# Patient Record
Sex: Male | Born: 1935 | Race: White | Hispanic: No | Marital: Married | Smoking: Current every day smoker
Health system: Southern US, Community
[De-identification: ages and names within clinical notes are randomized; demographics above are authoritative.]

## PROBLEM LIST (undated history)

## (undated) DIAGNOSIS — IMO0002 Reserved for concepts with insufficient information to code with codable children: Secondary | ICD-10-CM

## (undated) DIAGNOSIS — I4891 Unspecified atrial fibrillation: Secondary | ICD-10-CM

## (undated) DIAGNOSIS — M810 Age-related osteoporosis without current pathological fracture: Secondary | ICD-10-CM

## (undated) DIAGNOSIS — N289 Disorder of kidney and ureter, unspecified: Secondary | ICD-10-CM

## (undated) DIAGNOSIS — R609 Edema, unspecified: Secondary | ICD-10-CM

## (undated) DIAGNOSIS — R443 Hallucinations, unspecified: Secondary | ICD-10-CM

## (undated) DIAGNOSIS — I6930 Unspecified sequelae of cerebral infarction: Secondary | ICD-10-CM

## (undated) DIAGNOSIS — N400 Enlarged prostate without lower urinary tract symptoms: Secondary | ICD-10-CM

## (undated) DIAGNOSIS — K08109 Complete loss of teeth, unspecified cause, unspecified class: Secondary | ICD-10-CM

## (undated) DIAGNOSIS — N183 Chronic kidney disease, stage 3 unspecified: Secondary | ICD-10-CM

## (undated) DIAGNOSIS — I639 Cerebral infarction, unspecified: Secondary | ICD-10-CM

## (undated) DIAGNOSIS — E538 Deficiency of other specified B group vitamins: Secondary | ICD-10-CM

## (undated) DIAGNOSIS — F0390 Unspecified dementia without behavioral disturbance: Secondary | ICD-10-CM

## (undated) DIAGNOSIS — R2681 Unsteadiness on feet: Secondary | ICD-10-CM

## (undated) DIAGNOSIS — E559 Vitamin D deficiency, unspecified: Secondary | ICD-10-CM

## (undated) DIAGNOSIS — Z125 Encounter for screening for malignant neoplasm of prostate: Secondary | ICD-10-CM

## (undated) HISTORY — DX: Hallucinations, unspecified: R44.3

## (undated) HISTORY — DX: Chronic kidney disease, stage 3 (moderate): N18.3

## (undated) HISTORY — DX: Unspecified dementia, unspecified severity, without behavioral disturbance, psychotic disturbance, mood disturbance, and anxiety: F03.90

## (undated) HISTORY — DX: Age-related osteoporosis without current pathological fracture: M81.0

## (undated) HISTORY — DX: Reserved for concepts with insufficient information to code with codable children: IMO0002

## (undated) HISTORY — DX: Unspecified sequelae of cerebral infarction: I69.30

## (undated) HISTORY — DX: Benign prostatic hyperplasia without lower urinary tract symptoms: N40.0

## (undated) HISTORY — DX: Vitamin D deficiency, unspecified: E55.9

## (undated) HISTORY — PX: OTHER SURGICAL HISTORY: SHX169

## (undated) HISTORY — DX: Chronic kidney disease, stage 3 unspecified: N18.30

## (undated) HISTORY — DX: Complete loss of teeth, unspecified cause, unspecified class: K08.109

## (undated) HISTORY — DX: Edema, unspecified: R60.9

## (undated) HISTORY — DX: Unsteadiness on feet: R26.81

## (undated) HISTORY — DX: Encounter for screening for malignant neoplasm of prostate: Z12.5

## (undated) HISTORY — DX: Unspecified atrial fibrillation: I48.91

## (undated) HISTORY — DX: Deficiency of other specified B group vitamins: E53.8

---

## 2009-03-04 ENCOUNTER — Ambulatory Visit: Payer: Self-pay | Admitting: Family Medicine

## 2012-04-07 ENCOUNTER — Emergency Department: Payer: Self-pay | Admitting: Emergency Medicine

## 2013-03-29 ENCOUNTER — Ambulatory Visit: Payer: Self-pay | Admitting: Ophthalmology

## 2013-11-02 ENCOUNTER — Emergency Department: Payer: Self-pay | Admitting: Emergency Medicine

## 2013-11-02 LAB — COMPREHENSIVE METABOLIC PANEL
ALBUMIN: 3.3 g/dL — AB (ref 3.4–5.0)
ANION GAP: 9 (ref 7–16)
Alkaline Phosphatase: 69 U/L
BUN: 20 mg/dL — ABNORMAL HIGH (ref 7–18)
Bilirubin,Total: 2.3 mg/dL — ABNORMAL HIGH (ref 0.2–1.0)
CREATININE: 1.85 mg/dL — AB (ref 0.60–1.30)
Calcium, Total: 8.5 mg/dL (ref 8.5–10.1)
Chloride: 101 mmol/L (ref 98–107)
Co2: 24 mmol/L (ref 21–32)
EGFR (African American): 39 — ABNORMAL LOW
EGFR (Non-African Amer.): 34 — ABNORMAL LOW
Glucose: 100 mg/dL — ABNORMAL HIGH (ref 65–99)
OSMOLALITY: 271 (ref 275–301)
Potassium: 4.1 mmol/L (ref 3.5–5.1)
SGOT(AST): 49 U/L — ABNORMAL HIGH (ref 15–37)
SGPT (ALT): 27 U/L
SODIUM: 134 mmol/L — AB (ref 136–145)
TOTAL PROTEIN: 7.5 g/dL (ref 6.4–8.2)

## 2013-11-02 LAB — PROTIME-INR
INR: 1.3
Prothrombin Time: 15.8 secs — ABNORMAL HIGH (ref 11.5–14.7)

## 2013-11-02 LAB — TROPONIN I

## 2013-11-03 LAB — URINALYSIS, COMPLETE
BACTERIA: NONE SEEN
BILIRUBIN, UR: NEGATIVE
Glucose,UR: NEGATIVE mg/dL (ref 0–75)
KETONE: NEGATIVE
Leukocyte Esterase: NEGATIVE
Nitrite: NEGATIVE
PH: 5 (ref 4.5–8.0)
PROTEIN: NEGATIVE
RBC,UR: 44 /HPF (ref 0–5)
Specific Gravity: 1.021 (ref 1.003–1.030)
WBC UR: 2 /HPF (ref 0–5)

## 2013-11-03 LAB — CBC WITH DIFFERENTIAL/PLATELET
BANDS NEUTROPHIL: 6 %
COMMENT - H1-COM2: NORMAL
HCT: 48 % (ref 40.0–52.0)
HGB: 16 g/dL (ref 13.0–18.0)
LYMPHS PCT: 8 %
MCH: 32.9 pg (ref 26.0–34.0)
MCHC: 33.3 g/dL (ref 32.0–36.0)
MCV: 99 fL (ref 80–100)
MONOS PCT: 6 %
RBC: 4.85 10*6/uL (ref 4.40–5.90)
RDW: 14 % (ref 11.5–14.5)
Segmented Neutrophils: 80 %
WBC: 4.1 10*3/uL (ref 3.8–10.6)

## 2013-11-04 LAB — URINE CULTURE

## 2014-04-13 ENCOUNTER — Ambulatory Visit: Payer: Self-pay | Admitting: Family

## 2015-05-23 ENCOUNTER — Inpatient Hospital Stay: Payer: PRIVATE HEALTH INSURANCE | Admitting: Internal Medicine

## 2015-07-16 ENCOUNTER — Ambulatory Visit: Payer: PRIVATE HEALTH INSURANCE | Admitting: Internal Medicine

## 2015-07-30 ENCOUNTER — Encounter: Payer: Self-pay | Admitting: Hematology and Oncology

## 2015-07-30 ENCOUNTER — Inpatient Hospital Stay: Payer: Medicare (Managed Care)

## 2015-07-30 ENCOUNTER — Telehealth: Payer: Self-pay | Admitting: *Deleted

## 2015-07-30 ENCOUNTER — Inpatient Hospital Stay: Payer: Medicare (Managed Care) | Attending: Hematology and Oncology | Admitting: Hematology and Oncology

## 2015-07-30 VITALS — BP 134/82 | HR 60 | Temp 96.2°F | Resp 18 | Ht 67.0 in | Wt 139.4 lb

## 2015-07-30 DIAGNOSIS — I69349 Monoplegia of lower limb following cerebral infarction affecting unspecified side: Secondary | ICD-10-CM | POA: Diagnosis not present

## 2015-07-30 DIAGNOSIS — Z8673 Personal history of transient ischemic attack (TIA), and cerebral infarction without residual deficits: Secondary | ICD-10-CM

## 2015-07-30 DIAGNOSIS — Z9181 History of falling: Secondary | ICD-10-CM | POA: Diagnosis not present

## 2015-07-30 DIAGNOSIS — D7589 Other specified diseases of blood and blood-forming organs: Secondary | ICD-10-CM

## 2015-07-30 DIAGNOSIS — F17210 Nicotine dependence, cigarettes, uncomplicated: Secondary | ICD-10-CM | POA: Insufficient documentation

## 2015-07-30 DIAGNOSIS — M549 Dorsalgia, unspecified: Secondary | ICD-10-CM | POA: Diagnosis not present

## 2015-07-30 DIAGNOSIS — R7989 Other specified abnormal findings of blood chemistry: Secondary | ICD-10-CM

## 2015-07-30 DIAGNOSIS — R05 Cough: Secondary | ICD-10-CM | POA: Diagnosis not present

## 2015-07-30 DIAGNOSIS — K449 Diaphragmatic hernia without obstruction or gangrene: Secondary | ICD-10-CM | POA: Diagnosis not present

## 2015-07-30 DIAGNOSIS — D696 Thrombocytopenia, unspecified: Secondary | ICD-10-CM | POA: Diagnosis not present

## 2015-07-30 DIAGNOSIS — Z87311 Personal history of (healed) other pathological fracture: Secondary | ICD-10-CM | POA: Insufficient documentation

## 2015-07-30 DIAGNOSIS — F0390 Unspecified dementia without behavioral disturbance: Secondary | ICD-10-CM | POA: Diagnosis not present

## 2015-07-30 DIAGNOSIS — N183 Chronic kidney disease, stage 3 (moderate): Secondary | ICD-10-CM | POA: Diagnosis not present

## 2015-07-30 DIAGNOSIS — R634 Abnormal weight loss: Secondary | ICD-10-CM

## 2015-07-30 DIAGNOSIS — Z7901 Long term (current) use of anticoagulants: Secondary | ICD-10-CM | POA: Diagnosis not present

## 2015-07-30 DIAGNOSIS — N400 Enlarged prostate without lower urinary tract symptoms: Secondary | ICD-10-CM | POA: Insufficient documentation

## 2015-07-30 DIAGNOSIS — R17 Unspecified jaundice: Secondary | ICD-10-CM | POA: Insufficient documentation

## 2015-07-30 DIAGNOSIS — K5730 Diverticulosis of large intestine without perforation or abscess without bleeding: Secondary | ICD-10-CM | POA: Diagnosis not present

## 2015-07-30 DIAGNOSIS — R791 Abnormal coagulation profile: Secondary | ICD-10-CM | POA: Diagnosis present

## 2015-07-30 DIAGNOSIS — I4891 Unspecified atrial fibrillation: Secondary | ICD-10-CM

## 2015-07-30 LAB — HEPATIC FUNCTION PANEL
ALT: 10 U/L — ABNORMAL LOW (ref 17–63)
AST: 21 U/L (ref 15–41)
Albumin: 3.8 g/dL (ref 3.5–5.0)
Alkaline Phosphatase: 70 U/L (ref 38–126)
Bilirubin, Direct: 0.5 mg/dL (ref 0.1–0.5)
Indirect Bilirubin: 2.8 mg/dL — ABNORMAL HIGH (ref 0.3–0.9)
Total Bilirubin: 3.3 mg/dL — ABNORMAL HIGH (ref 0.3–1.2)
Total Protein: 7.5 g/dL (ref 6.5–8.1)

## 2015-07-30 LAB — CBC WITH DIFFERENTIAL/PLATELET
Basophils Absolute: 0 10*3/uL (ref 0–0.1)
Basophils Relative: 0 %
Eosinophils Absolute: 0.2 10*3/uL (ref 0–0.7)
Eosinophils Relative: 4 %
HCT: 44.8 % (ref 40.0–52.0)
Hemoglobin: 15.3 g/dL (ref 13.0–18.0)
Lymphocytes Relative: 31 %
Lymphs Abs: 1.9 10*3/uL (ref 1.0–3.6)
MCH: 32.8 pg (ref 26.0–34.0)
MCHC: 34.1 g/dL (ref 32.0–36.0)
MCV: 96.2 fL (ref 80.0–100.0)
Monocytes Absolute: 0.3 10*3/uL (ref 0.2–1.0)
Monocytes Relative: 6 %
Neutro Abs: 3.6 10*3/uL (ref 1.4–6.5)
Neutrophils Relative %: 59 %
Platelets: 107 10*3/uL — ABNORMAL LOW (ref 150–440)
RBC: 4.66 MIL/uL (ref 4.40–5.90)
RDW: 14.1 % (ref 11.5–14.5)
WBC: 6 10*3/uL (ref 3.8–10.6)

## 2015-07-30 LAB — RETICULOCYTES
RBC.: 4.66 MIL/uL (ref 4.40–5.90)
Retic Count, Absolute: 55.9 10*3/uL (ref 19.0–183.0)
Retic Ct Pct: 1.2 % (ref 0.4–3.1)

## 2015-07-30 LAB — TSH: TSH: 4.379 u[IU]/mL (ref 0.350–4.500)

## 2015-07-30 LAB — FOLATE: Folate: 16.7 ng/mL (ref >5.9)

## 2015-07-30 LAB — VITAMIN B12: Vitamin B-12: 192 pg/mL (ref 180–914)

## 2015-07-30 LAB — FIBRIN DERIVATIVES D-DIMER (ARMC ONLY): Fibrin derivatives D-dimer (ARMC): 3307 — ABNORMAL HIGH (ref 0–499)

## 2015-07-30 NOTE — Telephone Encounter (Signed)
Called the only phone number I have which is mobile and left message that pt has appt u/s tom at 2 pm.  I also got a call from [facility] and tiffany said they will transport the pt to the u/s appt. I did leave this info on the message for the pt.

## 2015-07-30 NOTE — Progress Notes (Signed)
Pt unsure of why he is here today.  Pt lives with daughter and son in law in [place].  No complaints no concerns

## 2015-07-30 NOTE — Progress Notes (Addendum)
[facility]  Clinic day:  07/30/2015  Chief Complaint: Raymond Hughes is a 82 y.o. male with an elevated D-dimer who is referred in consultation by Raymond Salts, FNP for assessment and management.  HPI: The patient has a history of atrial fibrillation dating back to 2010. Per report, he has been off Coumadin since 06/2013. He denies any history of thrombosis. 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 He noted a history of back pain for 6 days aggravated by his fall. Plain films revealed a T12 compression fracture. D-dimers were markedly elevated 2425 (<230).  CBC revealed a hematocrit of 46.0, hemoglobin 15.1, MCV 100.8, platelets 117,000, white count 6700 with an ANC of 4300. Differential was unremarkable.  Comprehensive metabolic panel included a creatinine of 1.2 to a bilirubin of 3.3. Alkaline phosphatase was normal (89).  Troponin was less than 0.06. EKG confirmed atrial fibrillation without acute ST abnormality.   CT of the lumbar spine revealed a T12 compression fracture with small central loss of height. There was also compression deformity of L5 increased since 08/06/2014. CT angiogram revealed no evidence of pulmonary emboli.  He had extensive calcified and noncalcified atheromatous plaque throughout the aorta. He had a large hiatal hernia and emphysema. There was colonic diverticulosis. There was a T12 and L5 compression fracture.   Bilateral lower extremity duplex on 05/04/2015 by [name] revealed no evidence of DVT in the left lower extremity. Right lower extremity ultrasound was  limited as the patient was agitated and would not allow the technician to evaluate it. Follow-up right lower extremity duplex was recommended.  Follow-up D-dimer was 9.04 (0 - 0.49) on 07/26/2015.  Symptomatically, he notes back pain. He has lost about 20  pounds over an unspecified period of time. He has a chronic cough secondary to smoking. He note trouble swallowing food at times. He has not had an EGD or colonoscopy in " a while".  He denies any melena or hematochezia. He drinks 1 shot of alcohol ([place] 101) a month.   Past Medical History  Diagnosis Date  . Edema   . Dementia   . Osteoporosis   . Hemiplegia affecting non-dominant side, post-stroke (HCC)   . Complete edentulism   . Benign prostatic hypertrophy   . Hallucinations   . Late effect of stroke   . Chronic kidney disease, stage III (moderate)   . Unsteady gait   . Vitamin D deficiency   . Vitamin B12 deficiency   . Special screening for malignant neoplasm of prostate   . Atrial fibrillation (HCC)   . Stroke [name])     Past Surgical History  Procedure Laterality Date  . Cataracts      Family History  Problem Relation Age of Onset  . Diabetes Mother   . Cancer Daughter   . Diabetes Daughter   . Diabetes Son   . Diabetes Son   . Diabetes Son   . Diabetes Son     Social History:  reports that he has been smoking Cigarettes.  He has been smoking about 1.00 pack per day. He has never used smokeless tobacco. He reports that he drinks alcohol. He reports that he does not use illicit drugs.  He was in the [employer] for 10 years.  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  The  patient is originally from [place].  He moved to [place] to be with his daughter.  He then moved to [place] to be with another daughter.  He lives with his daughter and son-in-law in [place].  The patient is accompanied by Raymond Hughes from [name] today.  Allergies: No Known Allergies  Current Medications: Tylenol 650 mg po TID prn pain.  Review of Systems:  GENERAL:  Feels "fine".  No fevers or sweats .  Weight years ago was "240 pounds".  Weight loss of 20 pounds in unspecified period. PERFORMANCE STATUS  (ECOG):  2 HEENT:  No visual changes, runny nose, sore throat, mouth sores or tenderness. Lungs: Cough.  No shortness of breath.  No hemoptysis. Cardiac:  No chest pain, palpitations, orthopnea, or PND. GI:  Intermittent emesis.  Trouble swallowing food at times.  No nausea, vomiting, diarrhea, constipation, melena or hematochezia.  Long time since EGD or colonoscopy. GU:  No urgency, frequency, dysuria, or hematuria. Musculoskeletal:  No back pain.  Knee problems.  No muscle tenderness. Extremities:  No pain or swelling. Skin:  No rashes or skin changes. Neuro:  No headache, numbness or weakness, balance or coordination issues. Endocrine:  No diabetes, thyroid issues, hot flashes or night sweats. Psych:  No mood changes, depression or anxiety. Pain:  No focal pain. Review of systems:  All other systems reviewed and found to be negative.  Physical Exam: Blood pressure 134/82, pulse 60, temperature 96.2 F (35.7 C), temperature source Tympanic, resp. rate 18, height 5\' 7"  (1.702 m), weight 139 lb 7.1 oz (63.25 kg). GENERAL:  Elderly disheveled gentleman sitting comfortably in the exam room in no acute distress.  He has a rolling walker at his side. MENTAL STATUS:  Alert and oriented to person, place and time. HEAD:  Wallace Cullens hair and beard.  Normocephalic, atraumatic, face symmetric, no Cushingoid features. EYES:  Blue eyes.  Pupils equal round and reactive to light and accomodation.  No conjunctivitis or scleral icterus. ENT:  Oropharynx clear without lesion.  Upper and lower dentures.  Tongue normal. Mucous membranes moist.  RESPIRATORY:  Poor respiratory excursion.  Clear to auscultation without rales, wheezes or rhonchi. CARDIOVASCULAR:  Irregular rhythm without murmur, rub or gallop. ABDOMEN:  Soft, non-tender, with active bowel sounds, and no hepatosplenomegaly.  No masses. BACK:  Localizes pain to T12 and L4/L5 areas. SKIN:  Nicotine stained fingers.  No rashes, ulcers or  lesions. EXTREMITIES: Left lower extremity edema to knee.  No skin discoloration or tenderness.  No palpable cords. LYMPH NODES: No palpable cervical, supraclavicular, axillary or inguinal adenopathy  NEUROLOGICAL: Unremarkable. PSYCH:  Appropriate.  Appointment on 07/30/2015  Component Date Value Ref Range Status  . WBC 07/30/2015 6.0  3.8 - 10.6 K/uL Final  . RBC 07/30/2015 4.66  4.40 - 5.90 MIL/uL Final  . Hemoglobin 07/30/2015 15.3  13.0 - 18.0 g/dL Final  . HCT 96/06/5407 44.8  40.0 - 52.0 % Final  . MCV 07/30/2015 96.2  80.0 - 100.0 fL Final  . MCH 07/30/2015 32.8  26.0 - 34.0 pg Final  . MCHC 07/30/2015 34.1  32.0 - 36.0 g/dL Final  . RDW 81/19/1478 14.1  11.5 - 14.5 % Final  . Platelets 07/30/2015 107* 150 - 440 K/uL Final  . Neutrophils Relative % 07/30/2015 59   Final  . Neutro Abs 07/30/2015 3.6  1.4 - 6.5 K/uL Final  . Lymphocytes Relative 07/30/2015 31   Final  . Lymphs Abs 07/30/2015 1.9  1.0 - 3.6 K/uL Final  . Monocytes Relative 07/30/2015 6   Final  . Monocytes Absolute 07/30/2015 0.3  0.2 - 1.0 K/uL Final  . Eosinophils Relative 07/30/2015 4   Final  . Eosinophils Absolute 07/30/2015 0.2  0 - 0.7 K/uL Final  . Basophils Relative 07/30/2015 0   Final  . Basophils Absolute 07/30/2015 0.0  0 - 0.1 K/uL Final  . Vitamin B-12 07/30/2015 192  180 - 914 pg/mL Final   Comment: (NOTE) This assay is not validated for testing neonatal or myeloproliferative syndrome specimens for Vitamin B12 levels. [facility]   . Folate 07/30/2015 16.7  >5.9 ng/mL Final  . TSH 07/30/2015 4.379  0.350 - 4.500 uIU/mL Final  . Retic Ct Pct 07/30/2015 1.2  0.4 - 3.1 % Final  . RBC. 07/30/2015 4.66  4.40 - 5.90 MIL/uL Final  . Retic Count, Manual 07/30/2015 55.9  19.0 - 183.0 K/uL Final  . Fibrin derivatives D-dimer ([facility]) 07/30/2015 3307* 0 - 499 Final   Comment: <> Exclusion of Venous Thromboembolism (VTE) - OUTPATIENTS ONLY        (Emergency Department or [place])              0-499 ng/ml (FEU)  : With a low to intermediate pretest                                        probability for VTE this test result                                        excludes the diagnosis of VTE.           > 499 ng/ml (FEU)  : VTE not excluded.  Additional work up                                   for VTE is required.   <>  Testing on Inpatients and Evaluation of Disseminated Intravascular        Coagulation (DIC)             Reference Range:   0-499 ng/ml (FEU)   Office Visit on 07/30/2015  Component Date Value Ref Range Status  . Total Protein 07/30/2015 7.5  6.5 - 8.1 g/dL Final  . Albumin 16/10/960405/05/2015 3.8  3.5 - 5.0 g/dL Final  . AST 54/09/811905/05/2015 21  15 - 41 U/L Final  . ALT 07/30/2015 10* 17 - 63 U/L Final  . Alkaline Phosphatase 07/30/2015 70  38 - 126 U/L Final  . Total Bilirubin 07/30/2015 3.3* 0.3 - 1.2 mg/dL Final  . Bilirubin, Direct 07/30/2015 0.5  0.1 - 0.5 mg/dL Final  . Indirect Bilirubin 07/30/2015 2.8* 0.3 - 0.9 mg/dL Final    Assessment:  Raymond Hughes is a 82 y.o. male with a history of atrial fibrillation and left sided CVA with an elevated D-Dimer initially documented on 04/28/2015.  Chest CT angiogram on 04/28/2015 revealed no evidence of pulmonary embolism.  Left lower extremity duplex on 05/04/2015 revealed no evidence of DVT.  Right lower extremity duplex could not be performed secondary to patient cooperation.  He has a T12 and L5 compression fracture.  Outside labs reveal mild thrombocytopenia, macrocytic RBC indices,  and an elevated bilirubin.  Symptomatically, he notes back pain, some vague issues with swallowing.  He has lost 20 pounds in an unspecified period of time.  Exam reveals left lower extremity edema.  Plan: 1.  Discuss elevated D-dimer and additional lab abnormalities noted in ER.  Discuss follow-up testing.  Discuss repeating bilateral lower extremity duplex. 2.  Labs today: CBC with diff, LFTs, fractionated bilirubin, B12,  folate, TSH, retic, D-dimer,   3.  Bilateral lower extremity duplex.  Discuss importance of patient cooperation with study. 4.  Discuss potential kyphoplasty of compression fractures for relief of pain. 5.  Consider GI referral for EGD and colonoscopy. 6.  RTC in 1 week for review of testing.   Rosey Bath, MD  07/30/2015, 12:15 PM

## 2015-07-31 ENCOUNTER — Ambulatory Visit
Admission: RE | Admit: 2015-07-31 | Discharge: 2015-07-31 | Disposition: A | Discharge status: Home / Self Care | Payer: Medicare (Managed Care) | Source: Ambulatory Visit | Attending: Hematology and Oncology | Admitting: Hematology and Oncology

## 2015-07-31 ENCOUNTER — Encounter: Payer: Self-pay | Admitting: *Deleted

## 2015-07-31 ENCOUNTER — Telehealth: Payer: Self-pay | Admitting: Internal Medicine

## 2015-07-31 ENCOUNTER — Other Ambulatory Visit: Payer: Self-pay | Admitting: Internal Medicine

## 2015-07-31 ENCOUNTER — Inpatient Hospital Stay
Admission: EM | Admit: 2015-07-31 | Discharge: 2015-08-01 | DRG: 300 | Disposition: A | Discharge status: Home / Self Care | Payer: Medicare (Managed Care) | Attending: Internal Medicine | Admitting: Internal Medicine

## 2015-07-31 ENCOUNTER — Encounter: Payer: Self-pay | Admitting: Hematology and Oncology

## 2015-07-31 DIAGNOSIS — Z8673 Personal history of transient ischemic attack (TIA), and cerebral infarction without residual deficits: Secondary | ICD-10-CM | POA: Diagnosis not present

## 2015-07-31 DIAGNOSIS — E538 Deficiency of other specified B group vitamins: Secondary | ICD-10-CM | POA: Diagnosis present

## 2015-07-31 DIAGNOSIS — I69354 Hemiplegia and hemiparesis following cerebral infarction affecting left non-dominant side: Secondary | ICD-10-CM | POA: Diagnosis not present

## 2015-07-31 DIAGNOSIS — D696 Thrombocytopenia, unspecified: Secondary | ICD-10-CM | POA: Diagnosis present

## 2015-07-31 DIAGNOSIS — Z833 Family history of diabetes mellitus: Secondary | ICD-10-CM | POA: Diagnosis not present

## 2015-07-31 DIAGNOSIS — M810 Age-related osteoporosis without current pathological fracture: Secondary | ICD-10-CM | POA: Diagnosis present

## 2015-07-31 DIAGNOSIS — H269 Unspecified cataract: Secondary | ICD-10-CM | POA: Diagnosis present

## 2015-07-31 DIAGNOSIS — N183 Chronic kidney disease, stage 3 (moderate): Secondary | ICD-10-CM | POA: Diagnosis present

## 2015-07-31 DIAGNOSIS — Z79899 Other long term (current) drug therapy: Secondary | ICD-10-CM | POA: Diagnosis not present

## 2015-07-31 DIAGNOSIS — I4891 Unspecified atrial fibrillation: Secondary | ICD-10-CM | POA: Diagnosis present

## 2015-07-31 DIAGNOSIS — Z9119 Patient's noncompliance with other medical treatment and regimen: Secondary | ICD-10-CM

## 2015-07-31 DIAGNOSIS — I82412 Acute embolism and thrombosis of left femoral vein: Secondary | ICD-10-CM | POA: Insufficient documentation

## 2015-07-31 DIAGNOSIS — F0390 Unspecified dementia without behavioral disturbance: Secondary | ICD-10-CM | POA: Diagnosis present

## 2015-07-31 DIAGNOSIS — Z809 Family history of malignant neoplasm, unspecified: Secondary | ICD-10-CM | POA: Diagnosis not present

## 2015-07-31 DIAGNOSIS — I82409 Acute embolism and thrombosis of unspecified deep veins of unspecified lower extremity: Secondary | ICD-10-CM | POA: Diagnosis present

## 2015-07-31 DIAGNOSIS — R791 Abnormal coagulation profile: Secondary | ICD-10-CM | POA: Insufficient documentation

## 2015-07-31 DIAGNOSIS — F17210 Nicotine dependence, cigarettes, uncomplicated: Secondary | ICD-10-CM | POA: Diagnosis present

## 2015-07-31 DIAGNOSIS — N400 Enlarged prostate without lower urinary tract symptoms: Secondary | ICD-10-CM | POA: Diagnosis present

## 2015-07-31 DIAGNOSIS — R0902 Hypoxemia: Secondary | ICD-10-CM

## 2015-07-31 DIAGNOSIS — R7989 Other specified abnormal findings of blood chemistry: Secondary | ICD-10-CM

## 2015-07-31 DIAGNOSIS — I82402 Acute embolism and thrombosis of unspecified deep veins of left lower extremity: Principal | ICD-10-CM | POA: Diagnosis present

## 2015-07-31 DIAGNOSIS — E785 Hyperlipidemia, unspecified: Secondary | ICD-10-CM | POA: Insufficient documentation

## 2015-07-31 HISTORY — DX: Cerebral infarction, unspecified: I63.9

## 2015-07-31 LAB — CBC WITH DIFFERENTIAL/PLATELET
Basophils Absolute: 0.1 10*3/uL (ref 0–0.1)
Basophils Relative: 1 %
Eosinophils Absolute: 0.4 10*3/uL (ref 0–0.7)
Eosinophils Relative: 6 %
HEMATOCRIT: 41.4 % (ref 40.0–52.0)
HEMOGLOBIN: 14 g/dL (ref 13.0–18.0)
LYMPHS ABS: 2.2 10*3/uL (ref 1.0–3.6)
Lymphocytes Relative: 37 %
MCH: 33.2 pg (ref 26.0–34.0)
MCHC: 33.8 g/dL (ref 32.0–36.0)
MCV: 98.4 fL (ref 80.0–100.0)
MONO ABS: 0.5 10*3/uL (ref 0.2–1.0)
MONOS PCT: 9 %
NEUTROS ABS: 2.8 10*3/uL (ref 1.4–6.5)
NEUTROS PCT: 47 %
Platelets: 108 10*3/uL — ABNORMAL LOW (ref 150–440)
RBC: 4.21 MIL/uL — ABNORMAL LOW (ref 4.40–5.90)
RDW: 14.3 % (ref 11.5–14.5)
WBC: 5.9 10*3/uL (ref 3.8–10.6)

## 2015-07-31 LAB — BASIC METABOLIC PANEL
Anion gap: 7 (ref 5–15)
BUN: 17 mg/dL (ref 6–20)
CHLORIDE: 102 mmol/L (ref 101–111)
CO2: 30 mmol/L (ref 22–32)
CREATININE: 1.18 mg/dL (ref 0.61–1.24)
Calcium: 8.9 mg/dL (ref 8.9–10.3)
GFR calc Af Amer: >60 mL/min (ref >60)
GFR calc non Af Amer: 56 mL/min — ABNORMAL LOW (ref >60)
GLUCOSE: 72 mg/dL (ref 65–99)
Potassium: 4.2 mmol/L (ref 3.5–5.1)
Sodium: 139 mmol/L (ref 135–145)

## 2015-07-31 LAB — APTT: aPTT: 34 seconds (ref 24–36)

## 2015-07-31 LAB — PROTIME-INR
INR: 1.16
PROTHROMBIN TIME: 15 s (ref 11.4–15.0)

## 2015-07-31 MED ORDER — SODIUM CHLORIDE 0.9 % IV SOLN
INTRAVENOUS | Status: DC
  Administered 2015-07-31 – 2015-08-01 (×2): via INTRAVENOUS

## 2015-07-31 MED ORDER — ONDANSETRON HCL 4 MG PO TABS: 4.0000 mg | ORAL_TABLET | Freq: Four times a day (QID) | ORAL | Status: DC | PRN

## 2015-07-31 MED ORDER — VITAMIN B-12 1000 MCG PO TABS
1000.0000 ug | ORAL_TABLET | Freq: Every day | ORAL | Status: DC
  Administered 2015-07-31 – 2015-08-01 (×2): 1000 ug via ORAL
  Filled 2015-07-31 (×2): qty 1

## 2015-07-31 MED ORDER — ACETAMINOPHEN 650 MG RE SUPP
650.0000 mg | Freq: Four times a day (QID) | RECTAL | Status: DC | PRN
Start: 2015-07-31 — End: 2015-08-01

## 2015-07-31 MED ORDER — ONDANSETRON HCL 4 MG/2ML IJ SOLN: 4.0000 mg | Freq: Four times a day (QID) | INTRAMUSCULAR | Status: DC | PRN

## 2015-07-31 MED ORDER — HYDROCODONE-ACETAMINOPHEN 5-325 MG PO TABS: 1.0000 | ORAL_TABLET | ORAL | Status: DC | PRN

## 2015-07-31 MED ORDER — SODIUM CHLORIDE 0.9% FLUSH: 3.0000 mL | Freq: Two times a day (BID) | INTRAVENOUS | Status: DC

## 2015-07-31 MED ORDER — ACETAMINOPHEN 325 MG PO TABS: 325.0000 mg | ORAL_TABLET | Freq: Four times a day (QID) | ORAL | Status: DC | PRN

## 2015-07-31 MED ORDER — HEPARIN (PORCINE) IN NACL 100-0.45 UNIT/ML-% IJ SOLN
1000.0000 [IU]/h | INTRAMUSCULAR | Status: DC
  Administered 2015-07-31: 1050 [IU]/h via INTRAVENOUS
  Filled 2015-07-31 (×3): qty 250

## 2015-07-31 MED ORDER — ACETAMINOPHEN 325 MG PO TABS
650.0000 mg | ORAL_TABLET | Freq: Four times a day (QID) | ORAL | Status: DC | PRN
Start: 2015-07-31 — End: 2015-08-01

## 2015-07-31 MED ORDER — HEPARIN BOLUS VIA INFUSION
3500.0000 [IU] | Freq: Once | INTRAVENOUS | Status: AC
  Administered 2015-07-31: 3500 [IU] via INTRAVENOUS
  Filled 2015-07-31: qty 3500

## 2015-07-31 MED ORDER — VITAMIN D 1000 UNITS PO TABS
1000.0000 [IU] | ORAL_TABLET | Freq: Every day | ORAL | Status: DC
  Administered 2015-07-31 – 2015-08-01 (×2): 1000 [IU] via ORAL
  Filled 2015-07-31 (×2): qty 1

## 2015-07-31 NOTE — ED Notes (Signed)
Triage states that they drew a blue top tube for the lab to be done - lab states that they do not have a blue top tube for PT-INR or APTT - lab will be redrawn

## 2015-07-31 NOTE — ED Provider Notes (Signed)
[facility] Emergency Department Provider Note    ____________________________________________  Time seen: ~1640  I have reviewed the triage vital signs and the nursing notes.   HISTORY  Chief Complaint Leg Pain   History limited by: Not Limited   HPI Raymond Hughes is a 82 y.o. male who presents to the emergency department today because of concerns for DVT found on outpatient ultrasound today. Patient states he has been having some pain and swelling in his left leg. His also found to have an elevated d-dimer on outpatient testing. Patient denies any associated chest pain or shortness breath. Patient states that he has been prescribed anticoagulation in the past however stopped taking it because he didn't want his blood to get to 10.   Past Medical History  Diagnosis Date  . Edema   . Dementia   . Osteoporosis   . Hemiplegia affecting non-dominant side, post-stroke (HCC)   . Complete edentulism   . Benign prostatic hypertrophy   . Hallucinations   . Late effect of stroke   . Chronic kidney disease, stage III (moderate)   . Unsteady gait   . Vitamin D deficiency   . Vitamin B12 deficiency   . Special screening for malignant neoplasm of prostate   . Atrial fibrillation [facility](HCC)     Patient Active Problem List   Diagnosis Date Noted  . Hyperlipidemia 07/31/2015  . Cigarette smoker 07/31/2015  . Positive D dimer 07/30/2015  . Elevated bilirubin 07/30/2015  . Macrocytosis without anemia 07/30/2015  . Thrombocytopenia (HCC) 07/30/2015    History reviewed. No pertinent past surgical history.  Current Outpatient Rx  Name  Route  Sig  Dispense  Refill  . acetaminophen (TYLENOL) 325 MG tablet   Oral   Take 650 mg by mouth.           Allergies Review of patient's allergies indicates no known allergies.  Family History  Problem Relation Age of Onset  . Diabetes Mother   . Cancer Daughter   . Diabetes Daughter   . Diabetes Son   .  Diabetes Son   . Diabetes Son   . Diabetes Son     Social History Social History  Substance Use Topics  . Smoking status: Current Every Day Smoker    Types: Cigarettes  . Smokeless tobacco: Never Used  . Alcohol Use: Yes     Comment: Occasionally once a month     Review of Systems  Constitutional: Negative for fever. Cardiovascular: Negative for chest pain. Respiratory: Negative for shortness of breath. Gastrointestinal: Negative for abdominal pain, vomiting and diarrhea. Neurological: Negative for headaches, focal weakness or numbness.  10-point ROS otherwise negative.  ____________________________________________   PHYSICAL EXAM:  VITAL SIGNS: ED Triage Vitals  Enc Vitals Group     BP 07/31/15 1556 90/74 mmHg     Pulse Rate 07/31/15 1556 61     Resp 07/31/15 1556 18     Temp 07/31/15 1556 98.1 F (36.7 C)     Temp Source 07/31/15 1556 Oral     SpO2 07/31/15 1556 95 %     Weight 07/31/15 1556 140 lb (63.504 kg)     Height 07/31/15 1556 5\' 6"  (1.676 m)     Head Cir --      Peak Flow --      Pain Score 07/31/15 1556 0   Constitutional: Alert and oriented. Well appearing and in no distress. Eyes: Conjunctivae are normal. PERRL. Normal extraocular movements. ENT   Head:  Normocephalic and atraumatic.   Nose: No congestion/rhinnorhea.   Mouth/Throat: Mucous membranes are moist.   Neck: No stridor. Hematological/Lymphatic/Immunilogical: No cervical lymphadenopathy. Cardiovascular: Normal rate, regular rhythm.  No murmurs, rubs, or gallops. Respiratory: Normal respiratory effort without tachypnea nor retractions. Breath sounds are clear and equal bilaterally. No wheezes/rales/rhonchi. Gastrointestinal: Soft and nontender. No distention.  Genitourinary: Deferred Musculoskeletal: Left leg with edema. Nontender to palpation. Neurologic:  Normal speech and language. No gross focal neurologic deficits are appreciated.  Skin:  Skin is warm, dry and intact.  No rash noted. Psychiatric: Mood and affect are normal. Speech and behavior are normal. Patient exhibits appropriate insight and judgment.  ____________________________________________    LABS (pertinent positives/negatives)  Labs Reviewed  CBC WITH DIFFERENTIAL/PLATELET - Abnormal; Notable for the following:    RBC 4.21 (*)    Platelets 108 (*)    All other components within normal limits  BASIC METABOLIC PANEL - Abnormal; Notable for the following:    GFR calc non Af Amer 56 (*)    All other components within normal limits  PROTIME-INR  APTT  CBC  HEPARIN LEVEL (UNFRACTIONATED)  BASIC METABOLIC PANEL     ____________________________________________   EKG  None  ____________________________________________    RADIOLOGY  US venous LE bilateral IMPRESSION: Positive for deep venous thrombosis at the saphenous femoral junction and common femoral vein on the LEFT. A portion of the thrombus in the common femoral vein appears mobile.   ____________________________________________   PROCEDURES  Procedure(s) performed: None  Critical Care performed: No  ____________________________________________   INITIAL IMPRESSION / ASSESSMENT AND PLAN / ED COURSE  Pertinent labs & imaging results that were available during my care of the patient were reviewed by me and considered in my medical decision making (see chart for details).  Patient presented to the emergency department today because of concerns for DVT found on outpatient ultrasound. Given that it was mobile I discussed with both oncologist Mesler surgeon on call. Recommend heparin drip at this time. Recommend bed rest. I discussed these recommendations with the admitting doctor.  ____________________________________________   FINAL CLINICAL IMPRESSION(S) / ED DIAGNOSES  Final diagnoses:  Thromboembolism of deep lower extremity veins, left (HCC)     Phineas Semen, MD 07/31/15 1610

## 2015-07-31 NOTE — Progress Notes (Addendum)
ANTICOAGULATION CONSULT NOTE - Initial Consult  Pharmacy Consult for Heparin Indication: DVT  No Known Allergies  Patient Measurements: Height: 5\' 6"  (167.6 cm) Weight: 140 lb (63.504 kg) IBW/kg (Calculated) : 63.8 Heparin Dosing Weight: 63.5 kg  Vital Signs: Temp: 98.1 F (36.7 C) (05/03 1556) Temp Source: Oral (05/03 1556) BP: 90/74 mmHg (05/03 1556) Pulse Rate: 61 (05/03 1556)  Labs:  Recent Labs  07/30/15 1259  HGB 15.3  HCT 44.8  PLT 107*    CrCl cannot be calculated (Patient has no serum creatinine result on file.).   Assessment: 82 yo male here with DVT. Pt denies being on an oral anticoagulant.   Hgb 14.0, Plt 108 - per admitting MD, ok to start heparin with plt 108 INR 1.16, aPTT 34  Goal of Therapy:  Heparin level 0.3-0.7 units/ml Monitor platelets by anticoagulation protocol: Yes   Plan:  Heparin bolus 3500 units IV x1 then heparin drip 1050 units/hr (=10.5 ml/hr) Heparin level in 8h after starting drip CBC in AM  Pharmacy will continue to follow.   Crist FatWang, Renada Cronin L 07/31/2015,5:40 PM

## 2015-07-31 NOTE — ED Notes (Signed)
Arrives from US with a DVT mobile in left leg, pt denies any pain at present

## 2015-07-31 NOTE — H&P (Signed)
[facility] [place] [facility]   PATIENT NAME: Raymond Hughes    MR#:  [id number]  DATE OF BIRTH:  1932/04/23  DATE OF ADMISSION:  07/31/2015  PRIMARY CARE PHYSICIAN: Delton Prairie, FNP   REQUESTING/REFERRING PHYSICIAN: Derrill Kay M.D.  CHIEF COMPLAINT:   Chief Complaint  Patient presents with  . Leg Pain    HISTORY OF PRESENT ILLNESS: Raymond Hughes  is a 82 y.o. male with a known history of Previous CVA, dementia , BPH chronic kidney disease who presented with pain in his legs. He was seen in the emergency room and had ultrasound of his lower extremities. Thrombus in the common femoral vein which was felt to be mobile. The ED physician discussed the case with on-call vascular surgeon who recommended patient remained bed rest for the next 12 hours. As well as started on a heparin drip. Patient does have dementia and is a poor historian. He is not sure how long he's had the swelling. He otherwise denies any chest pain or shortness of breath. He does complain of chronic left-sided weakness due to a stroke.      PAST MEDICAL HISTORY:   Past Medical History  Diagnosis Date  . Edema   . Dementia   . Osteoporosis   . Hemiplegia affecting non-dominant side, post-stroke (HCC)   . Complete edentulism   . Benign prostatic hypertrophy   . Hallucinations   . Late effect of stroke   . Chronic kidney disease, stage III (moderate)   . Unsteady gait   . Vitamin D deficiency   . Vitamin B12 deficiency   . Special screening for malignant neoplasm of prostate   . Atrial fibrillation (HCC)     PAST SURGICAL HISTORY: Past Surgical History  Procedure Laterality Date  . Cataracts      SOCIAL HISTORY:  Social History  Substance Use Topics  . Smoking status: Current Every Day Smoker    Types: Cigarettes  . Smokeless tobacco: Never Used  . Alcohol Use: Yes     Comment: Occasionally once a month     FAMILY HISTORY:  Family History  Problem Relation Age  of Onset  . Diabetes Mother   . Cancer Daughter   . Diabetes Daughter   . Diabetes Son   . Diabetes Son   . Diabetes Son   . Diabetes Son     DRUG ALLERGIES: No Known Allergies  REVIEW OF SYSTEMS:   CONSTITUTIONAL: No fever, fatigue or weakness.  EYES: No blurred or double vision.  EARS, NOSE, AND THROAT: No tinnitus or ear pain.  RESPIRATORY: No cough, shortness of breath, wheezing or hemoptysis.  CARDIOVASCULAR: No chest pain, orthopnea, edema.  GASTROINTESTINAL: No nausea, vomiting, diarrhea or abdominal pain.  GENITOURINARY: No dysuria, hematuria.  ENDOCRINE: No polyuria, nocturia,  HEMATOLOGY: No anemia, easy bruising or bleeding SKIN: No rash or lesion. MUSCULOSKELETAL: No joint pain or arthritis.Left leg pain   NEUROLOGIC: No tingling, numbness, weakness.  PSYCHIATRY: No anxiety or depression.   MEDICATIONS AT HOME:  Prior to Admission medications   Medication Sig Start Date End Date Taking? Authorizing Provider  acetaminophen (TYLENOL) 325 MG tablet Take 325-650 mg by mouth every 6 (six) hours as needed for mild pain or headache.   Yes Historical Provider, MD  cholecalciferol (VITAMIN D) 1000 units tablet Take 1,000 Units by mouth daily.   Yes Historical Provider, MD  vitamin B-12 (CYANOCOBALAMIN) 1000 MCG tablet Take 1,000 mcg by mouth daily.   Yes Historical Provider,  MD      PHYSICAL EXAMINATION:   VITAL SIGNS: Blood pressure 90/74, pulse 61, temperature 98.1 F (36.7 C), temperature source Oral, resp. rate 18, height 5\' 6"  (1.676 m), weight 63.504 kg (140 lb), SpO2 95 %.  GENERAL:  82 y.o.-year-old patient lying in the bed with no acute distress.  EYES: Pupils equal, round, reactive to light and accommodation. No scleral icterus. Extraocular muscles intact.  HEENT: Head atraumatic, normocephalic. Oropharynx and nasopharynx clear.  NECK:  Supple, no jugular venous distention. No thyroid enlargement, no tenderness.  LUNGS: Normal breath sounds bilaterally, no  wheezing, rales,rhonchi or crepitation. No use of accessory muscles of respiration.  CARDIOVASCULAR: S1, S2 normal. No murmurs, rubs, or gallops.  ABDOMEN: Soft, nontender, nondistended. Bowel sounds present. No organomegaly or mass.  EXTREMITIES:Left leg swelling and edema NEUROLOGIC: Cranial nerves II through XII are intact. Muscle strength 5/5 in all extremities. Sensation intact. Gait not checked.  PSYCHIATRIC: The patient is alert and oriented x 3.  SKIN: No obvious rash, lesion, or ulcer.   LABORATORY PANEL:   CBC  Recent Labs Lab 07/30/15 1259 07/31/15 1557  WBC 6.0 5.9  HGB 15.3 14.0  HCT 44.8 41.4  PLT 107* 108*  MCV 96.2 98.4  MCH 32.8 33.2  MCHC 34.1 33.8  RDW 14.1 14.3  LYMPHSABS 1.9 2.2  MONOABS 0.3 0.5  EOSABS 0.2 0.4  BASOSABS 0.0 0.1   ------------------------------------------------------------------------------------------------------------------  Chemistries   Recent Labs Lab 07/30/15 1258 07/31/15 1557  NA  --  139  K  --  4.2  CL  --  102  CO2  --  30  GLUCOSE  --  72  BUN  --  17  CREATININE  --  1.18  CALCIUM  --  8.9  AST 21  --   ALT 10*  --   ALKPHOS 70  --   BILITOT 3.3*  --    ------------------------------------------------------------------------------------------------------------------ estimated creatinine clearance is 44.8 mL/min (by C-G formula based on Cr of 1.18). ------------------------------------------------------------------------------------------------------------------  Recent Labs  07/30/15 1259  TSH 4.379     Coagulation profile  Recent Labs Lab 07/31/15 1727  INR 1.16   ------------------------------------------------------------------------------------------------------------------- No results for input(s): DDIMER in the last 72 hours. -------------------------------------------------------------------------------------------------------------------  Cardiac Enzymes No results for input(s): CKMB,  TROPONINI, MYOGLOBIN in the last 168 hours.  Invalid input(s): CK ------------------------------------------------------------------------------------------------------------------ Invalid input(s): POCBNP  ---------------------------------------------------------------------------------------------------------------  Urinalysis    Component Value Date/Time   COLORURINE Amber 11/03/2013 0018   APPEARANCEUR Hazy 11/03/2013 0018   LABSPEC 1.021 11/03/2013 0018   PHURINE 5.0 11/03/2013 0018   GLUCOSEU Negative 11/03/2013 0018   HGBUR 3+ 11/03/2013 0018   BILIRUBINUR Negative 11/03/2013 0018   KETONESUR Negative 11/03/2013 0018   PROTEINUR Negative 11/03/2013 0018   NITRITE Negative 11/03/2013 0018   LEUKOCYTESUR Negative 11/03/2013 0018     RADIOLOGY: US Venous Img Lower Bilateral  07/31/2015  CLINICAL DATA:  Positive D-dimer. BILATERAL lower extremity swelling. EXAM: BILATERAL LOWER EXTREMITY VENOUS DOPPLER ULTRASOUND TECHNIQUE: Gray-scale sonography with graded compression, as well as color Doppler and duplex ultrasound were performed to evaluate the lower extremity deep venous systems from the level of the common femoral vein and including the common femoral, femoral, profunda femoral, popliteal and calf veins including the posterior tibial, peroneal and gastrocnemius veins when visible. The superficial great saphenous vein was also interrogated. Spectral Doppler was utilized to evaluate flow at rest and with distal augmentation maneuvers in the common femoral, femoral and popliteal veins. COMPARISON:  None. FINDINGS: RIGHT  LOWER EXTREMITY Common Femoral Vein: No evidence of thrombus. Normal compressibility, respiratory phasicity and response to augmentation. Saphenofemoral Junction: No evidence of thrombus. Normal compressibility and flow on color Doppler imaging. Profunda Femoral Vein: No evidence of thrombus. Normal compressibility and flow on color Doppler imaging. Femoral Vein: No  evidence of thrombus. Normal compressibility, respiratory phasicity and response to augmentation. Popliteal Vein: No evidence of thrombus. Normal compressibility, respiratory phasicity and response to augmentation. Calf Veins: No evidence of thrombus. Normal compressibility and flow on color Doppler imaging. Superficial Great Saphenous Vein: No evidence of thrombus. Normal compressibility and flow on color Doppler imaging. Venous Reflux:  None. Other Findings:  None. LEFT LOWER EXTREMITY Common Femoral Vein: Near occlusive thrombus is present. A portion in the common femoral vein is mobile. Saphenofemoral Junction: Near occlusive thrombus is present. There is no compressibility or flow. Profunda Femoral Vein: No evidence of thrombus. Normal compressibility and flow on color Doppler imaging. Femoral Vein: No evidence of thrombus. Normal compressibility, respiratory phasicity and response to augmentation. Popliteal Vein: No evidence of thrombus. Normal compressibility, respiratory phasicity and response to augmentation. Calf Veins: No evidence of thrombus. Normal compressibility and flow on color Doppler imaging. Superficial Great Saphenous Vein: No evidence of thrombus. Normal compressibility and flow on color Doppler imaging. Venous Reflux:  None. Other Findings:  None. IMPRESSION: Positive for deep venous thrombosis at the saphenous femoral junction and common femoral vein on the LEFT. A portion of the thrombus in the common femoral vein appears mobile. These results will be called to the ordering clinician or representative by the technologist. Electronically Signed   By: Elsie StainJohn T Curnes M.D.   On: 07/31/2015 15:16    EKG: Orders placed or performed in visit on 11/02/13  . EKG 12-Lead    IMPRESSION AND PLAN: Patient is a 82 year old presents with left leg swelling  1. Left leg swelling due to a DVT associated with a mobile clot Heparin drip Vascular surgery consult Patient can likely be switched to  oral anticoagulation with Xarelto tomorrow.  2. History of CVA currently not on any aspirin I will place him on low-dose aspirin  3. Thrombocytopenia appears to be chronic monitor with heparin therapy  4. Miscellaneous patient will be on heparin for DVT prophylaxis and treatment  All the records are reviewed and case discussed with ED provider. Management plans discussed with the patient, family and they are in agreement.  CODE STATUS:    Code Status Orders        Start     Ordered   07/31/15 1759  Full code   Continuous     07/31/15 1800    Code Status History    Date Active Date Inactive Code Status Order ID Comments User Context   This patient has a current code status but no historical code status.       TOTAL TIME TAKING CARE OF THIS PATIENT:55 minutes.    Auburn BilberryPATEL, Zakariyah Freimark M.D on 07/31/2015 at 6:06 PM  Between 7am to 6pm - Pager - [phone]  After 6pm go to [email or web address] - password [email or web address] [facility]  [facility]  Office  [phone]  CC: Primary care physician; Delton PrairieBROOKS, KEATAH B, FNP

## 2015-07-31 NOTE — ED Notes (Addendum)
Pt reports to the ER because his primary doctor states he has a positive blood clot in his left leg - the pt denies any pain at this time and is a difficult historian and wants to go home - he allows minimal assessment - bilat lower ext have +2 pitting edema - no noted warmth to either ext - MD Derrill KayGoodman at bedside assessing pt at this time

## 2015-07-31 NOTE — ED Notes (Addendum)
Cathy in lab stated that they had blood to run CBC and heparin level - awaiting results

## 2015-07-31 NOTE — ED Notes (Signed)
Pharmacy called to obtain heparin medications - per pharmacy verified with Dr Allena KatzPatel that he still wanted heparin given with platelet count of 108 and Dr Allena KatzPatel verbalized order to give Heparin as ordered

## 2015-07-31 NOTE — Telephone Encounter (Signed)
Spoke to ER physician regarding left lower extremity DVT/mobile clot. Recommend admission the hospital IV heparin; vascular surgery evaluation given the mobile blood clot.

## 2015-08-01 ENCOUNTER — Inpatient Hospital Stay: Payer: Medicare (Managed Care)

## 2015-08-01 ENCOUNTER — Encounter: Payer: Self-pay | Admitting: Hematology and Oncology

## 2015-08-01 LAB — BASIC METABOLIC PANEL
ANION GAP: 6 (ref 5–15)
BUN: 18 mg/dL (ref 6–20)
CHLORIDE: 109 mmol/L (ref 101–111)
CO2: 25 mmol/L (ref 22–32)
Calcium: 8.4 mg/dL — ABNORMAL LOW (ref 8.9–10.3)
Creatinine, Ser: 1.22 mg/dL (ref 0.61–1.24)
GFR calc Af Amer: >60 mL/min (ref >60)
GFR, EST NON AFRICAN AMERICAN: 54 mL/min — AB (ref >60)
GLUCOSE: 86 mg/dL (ref 65–99)
POTASSIUM: 4 mmol/L (ref 3.5–5.1)
Sodium: 140 mmol/L (ref 135–145)

## 2015-08-01 LAB — CBC
HCT: 36.2 % — ABNORMAL LOW (ref 40.0–52.0)
HEMOGLOBIN: 12.3 g/dL — AB (ref 13.0–18.0)
MCH: 32.5 pg (ref 26.0–34.0)
MCHC: 33.9 g/dL (ref 32.0–36.0)
MCV: 96 fL (ref 80.0–100.0)
Platelets: 90 10*3/uL — ABNORMAL LOW (ref 150–440)
RBC: 3.77 MIL/uL — AB (ref 4.40–5.90)
RDW: 14.5 % (ref 11.5–14.5)
WBC: 5.6 10*3/uL (ref 3.8–10.6)

## 2015-08-01 LAB — HEPARIN LEVEL (UNFRACTIONATED)
Heparin Unfractionated: 0.76 IU/mL — ABNORMAL HIGH (ref 0.30–0.70)
Heparin Unfractionated: 0.65 IU/mL (ref 0.30–0.70)

## 2015-08-01 MED ORDER — RIVAROXABAN (XARELTO) VTE STARTER PACK (15 & 20 MG): ORAL_TABLET | ORAL | Status: DC

## 2015-08-01 MED ORDER — RIVAROXABAN 15 MG PO TABS
15.0000 mg | ORAL_TABLET | Freq: Two times a day (BID) | ORAL | Status: DC
  Administered 2015-08-01: 15 mg via ORAL
  Filled 2015-08-01 (×3): qty 1

## 2015-08-01 NOTE — Evaluation (Signed)
Physical Therapy Evaluation Patient Details Name: Raymond Hughes MRN: [id number] DOB: December 21, 1932 Today's Date: 5/4/2017[name]   History of Present Illness  Pt admitted for DVT. Pt with positive DVT in saphenous femoral junction and common femoral vein. Pt is [facility]. Pt with history of CVA, dementia, CKD, and afib. Pt with complaints of pain in legs.   Clinical Impression  Pt is a pleasant 82 year old male who was admitted for L LE DVT. Pt demonstrates all bed mobility/transfers/ambulation at baseline level using rollater for assistance. Pt does not require any further PT needs at this time. Pt will be dc in house and does not require follow up. RN aware. Will dc current orders.      Follow Up Recommendations No PT follow up    Equipment Recommendations       Recommendations for Other Services       Precautions / Restrictions Precautions Precautions: Fall Restrictions Weight Bearing Restrictions: No      Mobility  Bed Mobility Overal bed mobility: Independent             General bed mobility comments: safe technique performed. Once seated at EOB, pt able to sit with independence  Transfers Overall transfer level: Modified independent Equipment used:  (rollater)             General transfer comment: safe technique performed with rollater. Once standing, pt with kyphotic posture  Ambulation/Gait Ambulation/Gait assistance: Min guard Ambulation Distance (Feet): 200 Feet Assistive device:  (rollater) Gait Pattern/deviations: Step-through pattern     General Gait Details: good gait speed noted during ambulation. No LOB. Pt able to carry conversation during ambulation without SOB symtoms. Safe technique performed using Dealerrollater  Stairs            Wheelchair Mobility    Modified Rankin (Stroke Patients Only)       Balance Overall balance assessment: Modified Independent                                           Pertinent  Vitals/Pain Pain Assessment: No/denies pain    Home Living Family/patient expects to be discharged to:: Private residence Living Arrangements: Children Available Help at Discharge:  (l[place] and [place] Type of Home: House         Home Equipment:  Nature conservation officer(rollater) Additional Comments: pt very vauge about home environment, however does [facility]    Prior Function Level of Independence: Independent with assistive device(s)               Hand Dominance        Extremity/Trunk Assessment   Upper Extremity Assessment: Overall WFL for tasks assessed           Lower Extremity Assessment: Overall WFL for tasks assessed         Communication   Communication: No difficulties  Cognition Arousal/Alertness: Awake/alert Behavior During Therapy: WFL for tasks assessed/performed Overall Cognitive Status: Within Functional Limits for tasks assessed                      General Comments      Exercises        Assessment/Plan    PT Assessment Patent does not need any further PT services  PT Diagnosis     PT Problem List    PT Treatment Interventions  PT Goals (Current goals can be found in the Care Plan section) Acute Rehab PT Goals Patient Stated Goal: to leave the hospital PT Goal Formulation: All assessment and education complete, DC therapy Time For Goal Achievement: 2015-08-29 Potential to Achieve Goals: Good    Frequency     Barriers to discharge        Co-evaluation               End of Session Equipment Utilized During Treatment: Gait belt Activity Tolerance: Patient tolerated treatment well Patient left: in bed;with bed alarm set Nurse Communication: Mobility status         Time: 1610-9604 PT Time Calculation (min) (ACUTE ONLY): 11 min   Charges:   PT Evaluation $PT Eval Low Complexity: 1 Procedure     PT G Codes:        Raymond Hughes 08/29/15, 4:19 PM Elizabeth Palau, PT,  DPT [phone]

## 2015-08-01 NOTE — Progress Notes (Signed)
Primedoc paged regarding pt 5 run Vtach, vss, denies any pain

## 2015-08-01 NOTE — Progress Notes (Signed)
attempted to talk to patient about xarelto but per RN pt has dementia and he is on and off. Family is not present. Left information about xarelto and starter pack in pt discharge instructions.  Olene FlossMelissa D Pyper Olexa, Pharm.D Clinical Pharmacist

## 2015-08-01 NOTE — Progress Notes (Signed)
Spoke with both the daughter, Donnamarie PoagJeanne and Durel SaltsKeatah Brooks, NP about the discharge plans for this gentleman. All are aware of his issues with noncompliance with medications (Coumadin) in the past and the need for continued encouragement of compliance to avoid consequences in the future. Daughter is aware she will be picking dad up later this pm (1900). NP will be calling daughter to coordinate services. No Xarelto Coupon is needed as pt is [facility] pt and all co-pays are covered. No further Cm needed at this time.

## 2015-08-01 NOTE — Consult Note (Signed)
[facility] Vascular Consult Note  MRN : [id number]  Raymond Hughes is a 82 y.o. (11-11-1932) male who presents with chief complaint of  Chief Complaint  Patient presents with  . Leg Pain  .  History of Present Illness: I am asked to see the patient by Dr. Nemiah Commander for evaluation for DVT.  He has a presenting symptoms of leg pain, and a duplex was done which showed left common femoral vein thrombus, with the appearance of mobile thrombus.  He has no ulceration or infection.  Reports that the pain is better.  No chest pain or shortness of breath.  No fever or chills.  Current Facility-Administered Medications  Medication Dose Route Frequency Provider Last Rate Last Dose  . 0.9 %  sodium chloride infusion   Intravenous Continuous Auburn Bilberry, MD 75 mL/hr at 08/01/15 0545    . acetaminophen (TYLENOL) tablet 650 mg  650 mg Oral Q6H PRN Auburn Bilberry, MD       Or  . acetaminophen (TYLENOL) suppository 650 mg  650 mg Rectal Q6H PRN Auburn Bilberry, MD      . acetaminophen (TYLENOL) tablet 325-650 mg  325-650 mg Oral Q6H PRN Auburn Bilberry, MD      . cholecalciferol (VITAMIN D) tablet 1,000 Units  1,000 Units Oral Daily Auburn Bilberry, MD   1,000 Units at 08/01/15 0933  . HYDROcodone-acetaminophen (NORCO/VICODIN) 5-325 MG per tablet 1-2 tablet  1-2 tablet Oral Q4H PRN Auburn Bilberry, MD      . ondansetron (ZOFRAN) tablet 4 mg  4 mg Oral Q6H PRN Auburn Bilberry, MD       Or  . ondansetron (ZOFRAN) injection 4 mg  4 mg Intravenous Q6H PRN Auburn Bilberry, MD      . Rivaroxaban (XARELTO) tablet 15 mg  15 mg Oral BID WC Enid Baas, MD      . sodium chloride flush (NS) 0.9 % injection 3 mL  3 mL Intravenous Q12H Auburn Bilberry, MD   3 mL at 07/31/15 2200  . vitamin B-12 (CYANOCOBALAMIN) tablet 1,000 mcg  1,000 mcg Oral Daily Auburn Bilberry, MD   1,000 mcg at 08/01/15 4098    Past Medical History  Diagnosis Date  . Edema   . Dementia   . Osteoporosis   .  Hemiplegia affecting non-dominant side, post-stroke (HCC)   . Complete edentulism   . Benign prostatic hypertrophy   . Hallucinations   . Late effect of stroke   . Chronic kidney disease, stage III (moderate)   . Unsteady gait   . Vitamin D deficiency   . Vitamin B12 deficiency   . Special screening for malignant neoplasm of prostate   . Atrial fibrillation (HCC)   . Stroke [facility])     Past Surgical History  Procedure Laterality Date  . Cataracts      Social History Social History  Substance Use Topics  . Smoking status: Current Every Day Smoker -- 1.00 packs/day    Types: Cigarettes  . Smokeless tobacco: Never Used  . Alcohol Use: Yes     Comment: Occasionally once a month   No IVDU  Family History Family History  Problem Relation Age of Onset  . Diabetes Mother   . Cancer Daughter   . Diabetes Daughter   . Diabetes Son   . Diabetes Son   . Diabetes Son   . Diabetes Son   no bleeding or clotting disorders  No Known Allergies   REVIEW OF SYSTEMS (Negative unless checked)  Constitutional: [] Weight loss  [] Fever  [] Chills Cardiac: [] Chest pain   [] Chest pressure   [x] Palpitations   [] Shortness of breath when laying flat   [] Shortness of breath at rest   [] Shortness of breath with exertion. Vascular:  [] Pain in legs with walking   [] Pain in legs at rest   [] Pain in legs when laying flat   [] Claudication   [] Pain in feet when walking  [] Pain in feet at rest  [] Pain in feet when laying flat   [x] History of DVT   [x] Phlebitis   [x] Swelling in legs   [] Varicose veins   [] Non-healing ulcers Pulmonary:   [] Uses home oxygen   [] Productive cough   [] Hemoptysis   [] Wheeze  [] COPD   [] Asthma Neurologic:  [] Dizziness  [] Blackouts   [] Seizures   [x] History of stroke   [] History of TIA  [] Aphasia   [] Temporary blindness   [] Dysphagia   [] Weakness or numbness in arms   [] Weakness or numbness in legs Musculoskeletal:  [] Arthritis   [] Joint swelling   [] Joint pain   [] Low back  pain Hematologic:  [] Easy bruising  [] Easy bleeding   [] Hypercoagulable state   [] Anemic  [] Hepatitis Gastrointestinal:  [] Blood in stool   [] Vomiting blood  [] Gastroesophageal reflux/heartburn   [] Difficulty swallowing. Genitourinary:  [x] Chronic kidney disease   [] Difficult urination  [] Frequent urination  [] Burning with urination   [] Blood in urine Skin:  [] Rashes   [] Ulcers   [] Wounds Psychological:  [] History of anxiety   []  History of major depression.  Physical Examination  Filed Vitals:   08/01/15 1408 08/01/15 1409 08/01/15 1411 08/01/15 1423  BP:      Pulse:      Temp:      TempSrc:      Resp:      Height:      Weight:      SpO2: 100% 98% 95% 98%   Body mass index is 22.22 kg/(m^2). Gen:  WD/WN, NAD Head: [place]/AT, No temporalis wasting. Prominent temp pulse not noted. Ear/Nose/Throat: Hearing grossly intact, nares w/o erythema or drainage, oropharynx w/o Erythema/Exudate Eyes: PERRLA, EOMI.  Neck: Supple, no nuchal rigidity.  No JVD.  Pulmonary:  Good air movement, equal bilaterally.  Cardiac: irregularly irregular Vascular:  Vessel Right Left  Radial Palpable Palpable  Ulnar Palpable Palpable  Brachial Palpable Palpable  Carotid Palpable, without bruit Palpable, without bruit  Aorta Not palpable N/A  Femoral Palpable Palpable  Popliteal Palpable Palpable  PT Palpable Palpable  DP Palpable Palpable   Gastrointestinal: soft, non-tender/non-distended. No guarding/reflex. No masses, surgical incisions, or scars. Musculoskeletal: M/S 5/5 throughout.  Extremities without ischemic changes.  No deformity or atrophy. Mild LLE edema. Neurologic: CN 2-12 intact. Pain and light touch intact in extremities.  Symmetrical.  Speech is fluent. Motor exam as listed above. Psychiatric: Judgment intact, Mood & affect appropriate for pt's clinical situation. Dermatologic: No rashes or ulcers noted.  No cellulitis or open wounds. Lymph : No Cervical, Axillary, or Inguinal  lymphadenopathy.  CBC Lab Results  Component Value Date   WBC 5.6 08/01/2015   HGB 12.3* 08/01/2015   HCT 36.2* 08/01/2015   MCV 96.0 08/01/2015   PLT 90* 08/01/2015    BMET    Component Value Date/Time   NA 140 08/01/2015 0330   NA 134* 11/02/2013 2144   K 4.0 08/01/2015 0330   K 4.1 11/02/2013 2144   CL 109 08/01/2015 0330   CL 101 11/02/2013 2144   CO2 25 08/01/2015 0330   CO2 24 11/02/2013  2144   GLUCOSE 86 08/01/2015 0330   GLUCOSE 100* 11/02/2013 2144   BUN 18 08/01/2015 0330   BUN 20* 11/02/2013 2144   CREATININE 1.22 08/01/2015 0330   CREATININE 1.85* 11/02/2013 2144   CALCIUM 8.4* 08/01/2015 0330   CALCIUM 8.5 11/02/2013 2144   GFRNONAA 54* 08/01/2015 0330   GFRNONAA 34* 11/02/2013 2144   GFRAA >60 08/01/2015 0330   GFRAA 39* 11/02/2013 2144   Estimated Creatinine Clearance: 42.6 mL/min (by C-G formula based on Cr of 1.22).  COAG Lab Results  Component Value Date   INR 1.16 07/31/2015   INR 1.3 11/02/2013    Radiology Dg Chest 2 View  08/01/2015  CLINICAL DATA:  Hypoxia, atrial fibrillation EXAM: CHEST  2 VIEW COMPARISON:  11/03/2013 FINDINGS: Mild cardiac enlargement stable. Vascular pattern normal. Moderate hiatal hernia. Mild inferior posterior opacity over both costophrenic angles on the lateral view. IMPRESSION: Mild dependent atelectasis and/or trace pleural fluid. Moderate hiatal hernia. Electronically Signed   By: Esperanza Heir M.D.   On: 08/01/2015 14:53   US Venous Img Lower Bilateral  07/31/2015  CLINICAL DATA:  Positive D-dimer. BILATERAL lower extremity swelling. EXAM: BILATERAL LOWER EXTREMITY VENOUS DOPPLER ULTRASOUND TECHNIQUE: Gray-scale sonography with graded compression, as well as color Doppler and duplex ultrasound were performed to evaluate the lower extremity deep venous systems from the level of the common femoral vein and including the common femoral, femoral, profunda femoral, popliteal and calf veins including the posterior tibial,  peroneal and gastrocnemius veins when visible. The superficial great saphenous vein was also interrogated. Spectral Doppler was utilized to evaluate flow at rest and with distal augmentation maneuvers in the common femoral, femoral and popliteal veins. COMPARISON:  None. FINDINGS: RIGHT LOWER EXTREMITY Common Femoral Vein: No evidence of thrombus. Normal compressibility, respiratory phasicity and response to augmentation. Saphenofemoral Junction: No evidence of thrombus. Normal compressibility and flow on color Doppler imaging. Profunda Femoral Vein: No evidence of thrombus. Normal compressibility and flow on color Doppler imaging. Femoral Vein: No evidence of thrombus. Normal compressibility, respiratory phasicity and response to augmentation. Popliteal Vein: No evidence of thrombus. Normal compressibility, respiratory phasicity and response to augmentation. Calf Veins: No evidence of thrombus. Normal compressibility and flow on color Doppler imaging. Superficial Great Saphenous Vein: No evidence of thrombus. Normal compressibility and flow on color Doppler imaging. Venous Reflux:  None. Other Findings:  None. LEFT LOWER EXTREMITY Common Femoral Vein: Near occlusive thrombus is present. A portion in the common femoral vein is mobile. Saphenofemoral Junction: Near occlusive thrombus is present. There is no compressibility or flow. Profunda Femoral Vein: No evidence of thrombus. Normal compressibility and flow on color Doppler imaging. Femoral Vein: No evidence of thrombus. Normal compressibility, respiratory phasicity and response to augmentation. Popliteal Vein: No evidence of thrombus. Normal compressibility, respiratory phasicity and response to augmentation. Calf Veins: No evidence of thrombus. Normal compressibility and flow on color Doppler imaging. Superficial Great Saphenous Vein: No evidence of thrombus. Normal compressibility and flow on color Doppler imaging. Venous Reflux:  None. Other Findings:  None.  IMPRESSION: Positive for deep venous thrombosis at the saphenous femoral junction and common femoral vein on the LEFT. A portion of the thrombus in the common femoral vein appears mobile. These results will be called to the ordering clinician or representative by the technologist. Electronically Signed   By: Elsie Stain M.D.   On: 07/31/2015 15:16     Assessment/Plan 1. LLE DVT with possible mobile thrombus seen on [place] yesterday.  This is not  a hard and strict indication for IVC filter any more, but is certainly reasonable.  Discussed with the patient that we do not have to place a filter, but can if he desires.  He does not desire a filter at this time.  Would discharge on Xarelto or Eliquis and consider initiation of compression stockings in one to two weeks. I will be happy to see him in the office if desired.   2. CKD stage 3, stable. 3. Atrial fibrillation.  Stable 4. History of stroke.  No recent symptoms   DEW,JASON, MD  08/01/2015 3:36 PM

## 2015-08-01 NOTE — Discharge Instructions (Signed)
Information on my medicine - XARELTO (rivaroxaban)  This medication education was reviewed with me or my healthcare representative as part of my discharge preparation.  The pharmacist that spoke with me during my hospital stay was:  Olene FlossMelissa D Clotiel Troop, [facility]  WHY WAS XARELTO PRESCRIBED FOR YOU? Xarelto was prescribed to treat blood clots that may have been found in the veins of your legs (deep vein thrombosis) or in your lungs (pulmonary embolism) and to reduce the risk of them occurring again.  What do you need to know about Xarelto? The starting dose is one 15 mg tablet taken TWICE daily with food for the FIRST 21 DAYS then on (enter date)  5/25  the dose is changed to one 20 mg tablet taken ONCE A DAY with your evening meal.  DO NOT stop taking Xarelto without talking to the health care provider who prescribed the medication.  Refill your prescription for 20 mg tablets before you run out.  After discharge, you should have regular check-up appointments with your healthcare provider that is prescribing your Xarelto.  In the future your dose may need to be changed if your kidney function changes by a significant amount.  What do you do if you miss a dose? If you are taking Xarelto TWICE DAILY and you miss a dose, take it as soon as you remember. You may take two 15 mg tablets (total 30 mg) at the same time then resume your regularly scheduled 15 mg twice daily the next day.  If you are taking Xarelto ONCE DAILY and you miss a dose, take it as soon as you remember on the same day then continue your regularly scheduled once daily regimen the next day. Do not take two doses of Xarelto at the same time.   Important Safety Information Xarelto is a blood thinner medicine that can cause bleeding. You should call your healthcare provider right away if you experience any of the following: ? Bleeding from an injury or your nose that does not stop. ? Unusual colored urine (red or dark brown) or  unusual colored stools (red or black). ? Unusual bruising for unknown reasons. ? A serious fall or if you hit your head (even if there is no bleeding).  Some medicines may interact with Xarelto and might increase your risk of bleeding while on Xarelto. To help avoid this, consult your healthcare provider or pharmacist prior to using any new prescription or non-prescription medications, including herbals, vitamins, non-steroidal anti-inflammatory drugs (NSAIDs) and supplements.  This website has more information on Xarelto: [email or web address].xarelto.[email or web address].

## 2015-08-01 NOTE — Progress Notes (Signed)
Pt stable. Patient given first dose of xeralto prior to discharge. IV removed. D/c instructions given and education provided. Prescriptions verified and given. Pt provided medication education. Pt states he understands instructions. Spoke with family member (patient's daughter) on the phone. Pt dressed and will be escorted out by staff. Will be driven home by family.

## 2015-08-01 NOTE — Care Management Note (Addendum)
Case Management Note  Patient Details  Name: Raymond Hughes MRN: [id number] Date of Birth: 13-Apr-1932  Subjective/Objective: 82 y.o. M referred to ED 07/31/2015 for LLE DVT. [facility] [place] and Daughter [place] prior to admission. Currently receiving IV Heparin. CM received call from Durel SaltsKeatah Brooks, [facility] who [facility] for this pt. [phone]. She shared her concerns that Mr Siri ColeHerbert is non-compliant with his medications, most worrisome his Coumadin in the past. That is the reason it was eventually discontinued. Some flavor of "psychosis" about medications he was taking.... Worried about where they were from, etc... CM will share this with regular CM and CSW.                 Action/Plan: Will continue to follow. No CM needs anticipated. Followed by [facility].    Expected Discharge Date:                  Expected Discharge Plan:     In-House Referral:     Discharge planning Services  CM Consult  Post Acute Care Choice:    Choice offered to:     DME Arranged:    DME Agency:     HH Arranged:    HH Agency:     Status of Service:  In process, will continue to follow  Medicare Important Message Given:    Date Medicare IM Given:    Medicare IM give by:    Date Additional Medicare IM Given:    Additional Medicare Important Message give by:     If discussed at Long Length of Stay Meetings, dates discussed:    Additional Comments:  Yvone NeuCrutchfield, Roni Friberg M, RN 08/01/2015, 11:36 AM

## 2015-08-01 NOTE — Discharge Summary (Signed)
[facility] [place] [facility]   PATIENT NAME: Raymond Hughes    MR#:  [id number]  DATE OF BIRTH:  Jul 12, 1932  DATE OF ADMISSION:  07/31/2015 ADMITTING PHYSICIAN: Auburn Bilberry, MD  DATE OF DISCHARGE: 08/01/2015  PRIMARY CARE PHYSICIAN: Delton Prairie, FNP    ADMISSION DIAGNOSIS:  Thromboembolism of deep lower extremity veins, left (HCC) [I82.402]  DISCHARGE DIAGNOSIS:  Active Problems:   DVT (deep venous thrombosis) (HCC)   SECONDARY DIAGNOSIS:   Past Medical History  Diagnosis Date  . Edema   . Dementia   . Osteoporosis   . Hemiplegia affecting non-dominant side, post-stroke (HCC)   . Complete edentulism   . Benign prostatic hypertrophy   . Hallucinations   . Late effect of stroke   . Chronic kidney disease, stage III (moderate)   . Unsteady gait   . Vitamin D deficiency   . Vitamin B12 deficiency   . Special screening for malignant neoplasm of prostate   . Atrial fibrillation (HCC)   . Stroke [facility])     HOSPITAL COURSE:   82 year old male with past medical history significant for atrial fibrillation not on any anticoagulation, CK D stage III, history of stroke, noncompliant with medications presents to the hospital secondary to left calf pain and elevated d-dimer.  #1 left lower extremity DVT with a possible mobile thrombus seen on Dopplers-appreciate vascular consult. -Patient was put on bedrest and also IV heparin drip. No acute indication for IVC filter at this time. -Can be pursued as an outpatient if needed. -Will be discharged on Xarelto. Compliance with his medications has been discussed. Compression stockings within 1-2 weeks as an outpatient need to be initiated  #2 atrial fibrillation-noncompliant. Rate is in 40s to 50s at times. Not in any beta blockers or calcium channel blockers. -Coumadin was stopped in the past due to noncompliance. Patient is now on Xarelto. He does have history of stroke.  #3 vitamin B12  deficiency-on replacements  Patient at baseline walks with a walker with a seat. Physical therapy worked with the patient and he is being discharged back to [facility] today  DISCHARGE CONDITIONS:   Stable  CONSULTS OBTAINED:   vascular consultation by Dr. Wyn Quaker  DRUG ALLERGIES:  No Known Allergies  DISCHARGE MEDICATIONS:   Current Discharge Medication List    START taking these medications   Details  Rivaroxaban 15 & 20 MG TBPK Take as directed on package: Start with one  tablet by mouth twice a day with food. On Day 22, switch to one  tablet once a day with food. Qty: 51 each, Refills: 1      CONTINUE these medications which have NOT CHANGED   Details  acetaminophen (TYLENOL) 325 MG tablet Take 325-650 mg by mouth every 6 (six) hours as needed for mild pain or headache.    cholecalciferol (VITAMIN D) 1000 units tablet Take 1,000 Units by mouth daily.    vitamin B-12 (CYANOCOBALAMIN) 1000 MCG tablet Take 1,000 mcg by mouth daily.         DISCHARGE INSTRUCTIONS:   1. Oncology follow-up with Dr. Merlene Pulling in 1-2 weeks 2. PCP follow-up appointments 2 weeks  If you experience worsening of your admission symptoms, develop shortness of breath, life threatening emergency, suicidal or homicidal thoughts you must seek medical attention immediately by calling 911 or calling your MD immediately  if symptoms less severe.  You Must read complete instructions/literature along with all the possible adverse reactions/side effects for all the  Medicines you take and that have been prescribed to you. Take any new Medicines after you have completely understood and accept all the possible adverse reactions/side effects.   Please note  You were cared for by a hospitalist during your hospital stay. If you have any questions about your discharge medications or the care you received while you were in the hospital after you are discharged, you can call the unit and asked to speak with the  hospitalist on call if the hospitalist that took care of you is not available. Once you are discharged, your primary care physician will handle any further medical issues. Please note that NO REFILLS for any discharge medications will be authorized once you are discharged, as it is imperative that you return to your primary care physician (or establish a relationship with a primary care physician if you do not have one) for your aftercare needs so that they can reassess your need for medications and monitor your lab values.    Today   CHIEF COMPLAINT:   Chief Complaint  Patient presents with  . Leg Pain    VITAL SIGNS:  Blood pressure 123/72, pulse 70, temperature 98 F (36.7 C), temperature source Oral, resp. rate 16, height 5\' 6"  (1.676 m), weight 62.415 kg (137 lb 9.6 oz), SpO2 98 %.  I/O:   Intake/Output Summary (Last 24 hours) at 08/01/15 1554 Last data filed at 08/01/15 0912  Gross per 24 hour  Intake   1159 ml  Output    475 ml  Net    684 ml    PHYSICAL EXAMINATION:   Physical Exam  GENERAL:  82 y.o.-year-old patient lying in the bed with no acute distress.  EYES: Pupils equal, round, reactive to light and accommodation. No scleral icterus. Extraocular muscles intact.  HEENT: Head atraumatic, normocephalic. Oropharynx and nasopharynx clear.  NECK:  Supple, no jugular venous distention. No thyroid enlargement, no tenderness.  LUNGS: Normal breath sounds bilaterally, no wheezing, rales,rhonchi or crepitation. No use of accessory muscles of respiration. Decreased bibasilar breath sounds CARDIOVASCULAR: S1, S2 normal. No murmurs, rubs, or gallops.  ABDOMEN: Soft, non-tender, non-distended. Bowel sounds present. No organomegaly or mass.  EXTREMITIES: No pedal edema, cyanosis, or clubbing. Minimal left calf tenderness NEUROLOGIC: Cranial nerves II through XII are intact. Muscle strength 5/5 in all extremities. Sensation intact. Gait not checked.  PSYCHIATRIC: The patient is  alert and oriented x 2-3.  SKIN: No obvious rash, lesion, or ulcer.   DATA REVIEW:   CBC  Recent Labs Lab 08/01/15 0330  WBC 5.6  HGB 12.3*  HCT 36.2*  PLT 90*    Chemistries   Recent Labs Lab 07/30/15 1258  08/01/15 0330  NA  --   < > 140  K  --   < > 4.0  CL  --   < > 109  CO2  --   < > 25  GLUCOSE  --   < > 86  BUN  --   < > 18  CREATININE  --   < > 1.22  CALCIUM  --   < > 8.4*  AST 21  --   --   ALT 10*  --   --   ALKPHOS 70  --   --   BILITOT 3.3*  --   --   < > = values in this interval not displayed.  Cardiac Enzymes No results for input(s): TROPONINI in the last 168 hours.  Microbiology Results  Results for orders placed  or performed in visit on 11/02/13  Urine culture     Status: None   Collection Time: 11/03/13 12:18 AM  Result Value Ref Range Status   Micro Text Report   Final       SOURCE: CLEAN CATCH    COMMENT                   MIXED BACTERIAL ORGANISMS   COMMENT                   RESULTS SUGGESTIVE OF CONTAMINATION   ANTIBIOTIC                                                        RADIOLOGY:  Dg Chest 2 View  08/01/2015  CLINICAL DATA:  Hypoxia, atrial fibrillation EXAM: CHEST  2 VIEW COMPARISON:  11/03/2013 FINDINGS: Mild cardiac enlargement stable. Vascular pattern normal. Moderate hiatal hernia. Mild inferior posterior opacity over both costophrenic angles on the lateral view. IMPRESSION: Mild dependent atelectasis and/or trace pleural fluid. Moderate hiatal hernia. Electronically Signed   By: Esperanza Heiraymond  Rubner M.D.   On: 08/01/2015 14:53   [place] Venous Img Lower Bilateral  07/31/2015  CLINICAL DATA:  Positive D-dimer. BILATERAL lower extremity swelling. EXAM: BILATERAL LOWER EXTREMITY VENOUS DOPPLER ULTRASOUND TECHNIQUE: Gray-scale sonography with graded compression, as well as color Doppler and duplex ultrasound were performed to evaluate the lower extremity deep venous systems from the level of the common femoral vein and including the  common femoral, femoral, profunda femoral, popliteal and calf veins including the posterior tibial, peroneal and gastrocnemius veins when visible. The superficial great saphenous vein was also interrogated. Spectral Doppler was utilized to evaluate flow at rest and with distal augmentation maneuvers in the common femoral, femoral and popliteal veins. COMPARISON:  None. FINDINGS: RIGHT LOWER EXTREMITY Common Femoral Vein: No evidence of thrombus. Normal compressibility, respiratory phasicity and response to augmentation. Saphenofemoral Junction: No evidence of thrombus. Normal compressibility and flow on color Doppler imaging. Profunda Femoral Vein: No evidence of thrombus. Normal compressibility and flow on color Doppler imaging. Femoral Vein: No evidence of thrombus. Normal compressibility, respiratory phasicity and response to augmentation. Popliteal Vein: No evidence of thrombus. Normal compressibility, respiratory phasicity and response to augmentation. Calf Veins: No evidence of thrombus. Normal compressibility and flow on color Doppler imaging. Superficial Great Saphenous Vein: No evidence of thrombus. Normal compressibility and flow on color Doppler imaging. Venous Reflux:  None. Other Findings:  None. LEFT LOWER EXTREMITY Common Femoral Vein: Near occlusive thrombus is present. A portion in the common femoral vein is mobile. Saphenofemoral Junction: Near occlusive thrombus is present. There is no compressibility or flow. Profunda Femoral Vein: No evidence of thrombus. Normal compressibility and flow on color Doppler imaging. Femoral Vein: No evidence of thrombus. Normal compressibility, respiratory phasicity and response to augmentation. Popliteal Vein: No evidence of thrombus. Normal compressibility, respiratory phasicity and response to augmentation. Calf Veins: No evidence of thrombus. Normal compressibility and flow on color Doppler imaging. Superficial Great Saphenous Vein: No evidence of thrombus.  Normal compressibility and flow on color Doppler imaging. Venous Reflux:  None. Other Findings:  None. IMPRESSION: Positive for deep venous thrombosis at the saphenous femoral junction and common femoral vein on the LEFT. A portion of the thrombus in the common femoral vein appears mobile. These results will  be called to the ordering clinician or representative by the technologist. Electronically Signed   By: Elsie Stain M.D.   On: 07/31/2015 15:16    EKG:   Orders placed or performed in visit on 11/02/13  . EKG 12-Lead      Management plans discussed with the patient, family and they are in agreement.  CODE STATUS:     Code Status Orders        Start     Ordered   07/31/15 1759  Full code   Continuous     07/31/15 1800    Code Status History    Date Active Date Inactive Code Status Order ID Comments User Context   This patient has a current code status but no historical code status.      TOTAL TIME TAKING CARE OF THIS PATIENT: 36 minutes.    Enid Baas M.D on 08/01/2015 at 3:54 PM  Between 7am to 6pm - Pager - [phone]  After 6pm go to [email or web address] - password [email or web address] [facility]  [place] [place] [facility]  Office  [phone]  CC: Primary care physician; Delton Prairie, FNP

## 2015-08-01 NOTE — Progress Notes (Signed)
ANTICOAGULATION CONSULT NOTE - Initial Consult  Pharmacy Consult for Heparin Indication: DVT  No Known Allergies  Patient Measurements: Height: 5\' 6"  (167.6 cm) Weight: 137 lb 9.6 oz (62.415 kg) IBW/kg (Calculated) : 63.8 Heparin Dosing Weight: 63.5 kg  Vital Signs: Temp: 98.2 F (36.8 C) (05/04 0436) Temp Source: Oral (05/04 0436) BP: 127/67 mmHg (05/04 0436) Pulse Rate: 59 (05/04 0436)  Labs:  Recent Labs  07/30/15 1259 07/31/15 1557 07/31/15 1727 08/01/15 0330  HGB 15.3 14.0  --  12.3*  HCT 44.8 41.4  --  36.2*  PLT 107* 108*  --  90*  APTT  --   --  34  --   LABPROT  --   --  15.0  --   INR  --   --  1.16  --   HEPARINUNFRC  --   --   --  0.76*  CREATININE  --  1.18  --  1.22    Estimated Creatinine Clearance: 42.6 mL/min (by C-G formula based on Cr of 1.22).   Assessment: 82 yo male here with DVT. Pt denies being on an oral anticoagulant.   Hgb 14.0, Plt 108 - per admitting MD, ok to start heparin with plt 108 INR 1.16, aPTT 34  Goal of Therapy:  Heparin level 0.3-0.7 units/ml Monitor platelets by anticoagulation protocol: Yes   Plan:  Heparin bolus 3500 units IV x1 then heparin drip 1050 units/hr (=10.5 ml/hr) Heparin level in 8h after starting drip CBC in AM  5/4 03:00 heparin level 0.76. Decrease rate to 1000 units/hr. Recheck in 8 hours.  Pharmacy will continue to follow.   Aralyn Nowak S 08/01/2015,5:11 AM

## 2015-08-01 NOTE — Progress Notes (Signed)
   08/01/15 1423  Oxygen Therapy/Pulse Ox  O2 Device Room Air  SpO2 98 %  Checked patients saturation after being called by RN.  Patient is alert and oriented to time and place and communicating well.  No peripheral or central cyanosis present at this time.  Good capillary refill noticed.  Md aware.

## 2015-08-02 ENCOUNTER — Telehealth: Payer: Self-pay

## 2015-08-02 NOTE — Telephone Encounter (Signed)
Returned [facility] phone call whether Dr. Merlene Pullingorcoran would still like to follow pt and per MD she would like to follow up.

## 2015-08-07 ENCOUNTER — Inpatient Hospital Stay: Payer: Medicare (Managed Care) | Admitting: Hematology and Oncology

## 2015-08-08 ENCOUNTER — Ambulatory Visit: Payer: PRIVATE HEALTH INSURANCE | Admitting: Hematology and Oncology

## 2015-12-11 ENCOUNTER — Encounter (INDEPENDENT_AMBULATORY_CARE_PROVIDER_SITE_OTHER): Payer: Self-pay

## 2015-12-11 DIAGNOSIS — I4891 Unspecified atrial fibrillation: Secondary | ICD-10-CM | POA: Insufficient documentation

## 2015-12-11 DIAGNOSIS — I999 Unspecified disorder of circulatory system: Secondary | ICD-10-CM | POA: Insufficient documentation

## 2015-12-11 DIAGNOSIS — I4821 Permanent atrial fibrillation: Secondary | ICD-10-CM

## 2015-12-11 DIAGNOSIS — F0391 Unspecified dementia with behavioral disturbance: Secondary | ICD-10-CM

## 2015-12-11 DIAGNOSIS — F03918 Unspecified dementia, unspecified severity, with other behavioral disturbance: Secondary | ICD-10-CM | POA: Insufficient documentation

## 2016-01-06 ENCOUNTER — Other Ambulatory Visit (INDEPENDENT_AMBULATORY_CARE_PROVIDER_SITE_OTHER): Payer: Self-pay | Admitting: Vascular Surgery

## 2016-01-06 DIAGNOSIS — I82402 Acute embolism and thrombosis of unspecified deep veins of left lower extremity: Secondary | ICD-10-CM

## 2016-01-07 ENCOUNTER — Ambulatory Visit (INDEPENDENT_AMBULATORY_CARE_PROVIDER_SITE_OTHER): Payer: Medicare (Managed Care)

## 2016-01-07 ENCOUNTER — Encounter (INDEPENDENT_AMBULATORY_CARE_PROVIDER_SITE_OTHER): Payer: Self-pay | Admitting: Vascular Surgery

## 2016-01-07 ENCOUNTER — Ambulatory Visit (INDEPENDENT_AMBULATORY_CARE_PROVIDER_SITE_OTHER): Payer: Medicare (Managed Care) | Admitting: Vascular Surgery

## 2016-01-07 VITALS — BP 126/80 | HR 53 | Resp 16 | Ht 62.0 in | Wt 141.0 lb

## 2016-01-07 DIAGNOSIS — I82412 Acute embolism and thrombosis of left femoral vein: Secondary | ICD-10-CM

## 2016-01-07 DIAGNOSIS — I82402 Acute embolism and thrombosis of unspecified deep veins of left lower extremity: Secondary | ICD-10-CM

## 2016-01-07 DIAGNOSIS — F17210 Nicotine dependence, cigarettes, uncomplicated: Secondary | ICD-10-CM

## 2016-01-07 DIAGNOSIS — I4891 Unspecified atrial fibrillation: Secondary | ICD-10-CM | POA: Diagnosis not present

## 2016-01-07 DIAGNOSIS — F0281 Dementia in other diseases classified elsewhere with behavioral disturbance: Secondary | ICD-10-CM | POA: Diagnosis not present

## 2016-01-07 DIAGNOSIS — G308 Other Alzheimer's disease: Secondary | ICD-10-CM | POA: Diagnosis not present

## 2016-01-07 DIAGNOSIS — G309 Alzheimer's disease, unspecified: Secondary | ICD-10-CM

## 2016-01-07 DIAGNOSIS — E785 Hyperlipidemia, unspecified: Secondary | ICD-10-CM | POA: Diagnosis not present

## 2016-01-07 MED ORDER — ASPIRIN EC 81 MG PO TBEC: 81.0000 mg | DELAYED_RELEASE_TABLET | Freq: Every day | ORAL | Status: DC

## 2016-01-07 NOTE — Assessment & Plan Note (Signed)
We discussed the absolute need for smoking cessation due to the deleterious nature of tobacco on the vascular system. We discussed the tobacco use would diminish patency of any intervention, and likely significantly worsen progressio of disease. We discussed multiple agents for quitting including replacement therapy or medications to reduce cravings such as Chantix. The patient voices their understanding of the importance of smoking cessation. 

## 2016-01-07 NOTE — Progress Notes (Signed)
MRN : [id number]  Raymond Hughes is a 82 y.o. (1932/05/01) male who presents with chief complaint of  Chief Complaint  Patient presents with  . Follow-up    ultrasound  .  History of Present Illness: Patient returns today in follow up of DVT of his left leg. He says he has not been taking anticoagulation now for a couple of months. He says his leg does not hurt with the exception of the arthritis in his knee. He does have some swelling in the leg but he did not really notice this. He does not take aspirin daily. He continues to smoke. His duplex today demonstrates nonocclusive left femoral vein DVT which is becoming more chronic but still has somewhat of an acute appearance.  Current Outpatient Prescriptions  Medication Sig Dispense Refill  . cholecalciferol (VITAMIN D) 1000 units tablet Take 1,000 Units by mouth daily.    . vitamin B-12 (CYANOCOBALAMIN) 1000 MCG tablet Take 1,000 mcg by mouth daily.    Marland Kitchen. acetaminophen (TYLENOL) 325 MG tablet Take 325-650 mg by mouth every 6 (six) hours as needed for mild pain or headache.     Current Facility-Administered Medications  Medication Dose Route Frequency Provider Last Rate Last Dose  . aspirin EC tablet 81 mg  81 mg Oral Daily Annice NeedyJason S Dew, MD        Past Medical History:  Diagnosis Date  . Atrial fibrillation (HCC)   . Benign prostatic hypertrophy   . Chronic kidney disease, stage III (moderate)   . Complete edentulism   . Dementia   . Edema   . Hallucinations   . Hemiplegia affecting non-dominant side, post-stroke (HCC)   . Late effect of stroke   . Osteoporosis   . Special screening for malignant neoplasm of prostate   . Stroke (HCC)   . Unsteady gait   . Vitamin B12 deficiency   . Vitamin D deficiency     Past Surgical History:  Procedure Laterality Date  . cataracts      Social History Social History  Substance Use Topics  . Smoking status: Current Every Day Smoker    Packs/day: 1.00    Types: Cigarettes  .  Smokeless tobacco: Never Used  . Alcohol use Yes     Comment: Occasionally once a month    No IV drug use  Family History Family History  Problem Relation Age of Onset  . Diabetes Mother   . Cancer Daughter   . Diabetes Daughter   . Diabetes Son   . Diabetes Son   . Diabetes Son   . Diabetes Son      No Known Allergies   REVIEW OF SYSTEMS (Negative unless checked)  Constitutional: [] Weight loss  [] Fever  [] Chills Cardiac: [] Chest pain   [] Chest pressure   [x] Palpitations   [] Shortness of breath when laying flat   [] Shortness of breath at rest   [] Shortness of breath with exertion. Vascular:  [] Pain in legs with walking   [] Pain in legs at rest   [] Pain in legs when laying flat   [] Claudication   [] Pain in feet when walking  [] Pain in feet at rest  [] Pain in feet when laying flat   [x] History of DVT   [] Phlebitis   [x] Swelling in legs   [] Varicose veins   [] Non-healing ulcers Pulmonary:   [] Uses home oxygen   [] Productive cough   [] Hemoptysis   [] Wheeze  [] COPD   [] Asthma Neurologic:  [] Dizziness  [] Blackouts   [] Seizures   []   History of stroke   [] History of TIA  [] Aphasia   [] Temporary blindness   [] Dysphagia   [] Weakness or numbness in arms   [] Weakness or numbness in legs Musculoskeletal:  [] Arthritis   [] Joint swelling   [] Joint pain   [] Low back pain Hematologic:  [] Easy bruising  [] Easy bleeding   [] Hypercoagulable state   [] Anemic   Gastrointestinal:  [] Blood in stool   [] Vomiting blood  [] Gastroesophageal reflux/heartburn   [] Abdominal pain Genitourinary:  [] Chronic kidney disease   [] Difficult urination  [] Frequent urination  [] Burning with urination   [] Hematuria Skin:  [] Rashes   [] Ulcers   [] Wounds Psychological:  [] History of anxiety   []  History of major depression.  Physical Examination  BP 126/80   Pulse (!) 53   Resp 16   Ht 5\' 2"  (1.575 m)   Wt 141 lb (64 kg)   BMI 25.79 kg/m  Gen:  WD/WN, NAD Head: [place]/AT, No temporalis wasting. Ear/Nose/Throat: Hearing  grossly intact, nares w/o erythema or drainage, trachea midline Eyes: PERRLA, EOMI. Sclera non-icteric Neck: Supple, no nuchal rigidity.  No JVD.  Pulmonary:  Good air movement, no use of accessory muscles.  Cardiac: RRR, normal S1, S2 Vascular:  Vessel Right Left  Radial Palpable Palpable                                   Gastrointestinal: soft, non-tender/non-distended. No guarding/reflex.  Musculoskeletal: M/S 5/5 throughout.  No deformity or atrophy. 1+ right lower extremity edema and 2+ left lower extremity edema. Neurologic: CN 2-12 intact. Pain and light touch intact in extremities.  Symmetrical.  Speech is fluent.  Psychiatric: Judgment intact, Mood & affect appropriate for pt's clinical situation. Dermatologic: No rashes or ulcers noted.  No cellulitis or open wounds. Lymph : No Cervical, Axillary, or Inguinal lymphadenopathy.      Labs No results found for this or any previous visit (from the past 2160 hour(s)).  Radiology No results found.    Assessment/Plan  Hyperlipidemia lipid control important in reducing the progression of atherosclerotic disease. Continue statin therapy   Atrial fibrillation (HCC) Rate controlled.    Cigarette smoker We discussed the absolute need for smoking cessation due to the deleterious nature of tobacco on the vascular system. We discussed the tobacco use would diminish patency of any intervention, and likely significantly worsen progressio of disease. We discussed multiple agents for quitting including replacement therapy or medications to reduce cravings such as Chantix. The patient voices their understanding of the importance of smoking cessation.   DVT (deep venous thrombosis) (HCC) The patient reports he has not been taking anticoagulation. His duplex today demonstrates nonocclusive left femoral vein DVT which is becoming more chronic but still has somewhat of an acute appearance. He has not been on anticoagulation for  several months, and is now about 5-6 months after the diagnosis. I would recommend aspirin therapy daily. I would recommend compression stockings to decrease the swelling. I will plan to see him back in 6 months or sooner if worsening problems develop in the interim.    Festus Barren, MD  01/07/2016 12:16 PM    This note was created with Dragon medical transcription system.  Any errors from dictation are purely unintentional

## 2016-01-07 NOTE — Assessment & Plan Note (Signed)
The patient reports he has not been taking anticoagulation. His duplex today demonstrates nonocclusive left femoral vein DVT which is becoming more chronic but still has somewhat of an acute appearance. He has not been on anticoagulation for several months, and is now about 5-6 months after the diagnosis. I would recommend aspirin therapy daily. I would recommend compression stockings to decrease the swelling. I will plan to see him back in 6 months or sooner if worsening problems develop in the interim.

## 2016-01-07 NOTE — Assessment & Plan Note (Addendum)
Rate controlled 

## 2016-01-07 NOTE — Assessment & Plan Note (Signed)
lipid control important in reducing the progression of atherosclerotic disease. Continue statin therapy  

## 2016-04-25 ENCOUNTER — Emergency Department: Payer: Medicare (Managed Care)

## 2016-04-25 ENCOUNTER — Observation Stay
Admission: EM | Admit: 2016-04-25 | Discharge: 2016-04-26 | Disposition: A | Discharge status: Home / Self Care | Payer: Medicare (Managed Care) | Attending: Internal Medicine | Admitting: Internal Medicine

## 2016-04-25 ENCOUNTER — Encounter: Payer: Self-pay | Admitting: Emergency Medicine

## 2016-04-25 DIAGNOSIS — M810 Age-related osteoporosis without current pathological fracture: Secondary | ICD-10-CM | POA: Diagnosis not present

## 2016-04-25 DIAGNOSIS — I517 Cardiomegaly: Secondary | ICD-10-CM | POA: Insufficient documentation

## 2016-04-25 DIAGNOSIS — I4581 Long QT syndrome: Secondary | ICD-10-CM | POA: Insufficient documentation

## 2016-04-25 DIAGNOSIS — I69354 Hemiplegia and hemiparesis following cerebral infarction affecting left non-dominant side: Secondary | ICD-10-CM | POA: Insufficient documentation

## 2016-04-25 DIAGNOSIS — K08109 Complete loss of teeth, unspecified cause, unspecified class: Secondary | ICD-10-CM | POA: Diagnosis not present

## 2016-04-25 DIAGNOSIS — I129 Hypertensive chronic kidney disease with stage 1 through stage 4 chronic kidney disease, or unspecified chronic kidney disease: Secondary | ICD-10-CM | POA: Diagnosis not present

## 2016-04-25 DIAGNOSIS — K449 Diaphragmatic hernia without obstruction or gangrene: Secondary | ICD-10-CM | POA: Diagnosis not present

## 2016-04-25 DIAGNOSIS — D7589 Other specified diseases of blood and blood-forming organs: Secondary | ICD-10-CM | POA: Diagnosis not present

## 2016-04-25 DIAGNOSIS — F17210 Nicotine dependence, cigarettes, uncomplicated: Secondary | ICD-10-CM | POA: Diagnosis not present

## 2016-04-25 DIAGNOSIS — E785 Hyperlipidemia, unspecified: Secondary | ICD-10-CM | POA: Diagnosis present

## 2016-04-25 DIAGNOSIS — D696 Thrombocytopenia, unspecified: Secondary | ICD-10-CM | POA: Insufficient documentation

## 2016-04-25 DIAGNOSIS — F0391 Unspecified dementia with behavioral disturbance: Secondary | ICD-10-CM | POA: Diagnosis not present

## 2016-04-25 DIAGNOSIS — N183 Chronic kidney disease, stage 3 unspecified: Secondary | ICD-10-CM | POA: Diagnosis present

## 2016-04-25 DIAGNOSIS — Z79899 Other long term (current) drug therapy: Secondary | ICD-10-CM | POA: Diagnosis not present

## 2016-04-25 DIAGNOSIS — I4891 Unspecified atrial fibrillation: Secondary | ICD-10-CM | POA: Diagnosis present

## 2016-04-25 DIAGNOSIS — R2681 Unsteadiness on feet: Secondary | ICD-10-CM | POA: Insufficient documentation

## 2016-04-25 DIAGNOSIS — I700 Atherosclerosis of aorta: Secondary | ICD-10-CM | POA: Insufficient documentation

## 2016-04-25 DIAGNOSIS — Z7982 Long term (current) use of aspirin: Secondary | ICD-10-CM | POA: Insufficient documentation

## 2016-04-25 DIAGNOSIS — R55 Syncope and collapse: Principal | ICD-10-CM | POA: Diagnosis present

## 2016-04-25 DIAGNOSIS — R17 Unspecified jaundice: Secondary | ICD-10-CM | POA: Insufficient documentation

## 2016-04-25 DIAGNOSIS — E559 Vitamin D deficiency, unspecified: Secondary | ICD-10-CM | POA: Insufficient documentation

## 2016-04-25 DIAGNOSIS — N400 Enlarged prostate without lower urinary tract symptoms: Secondary | ICD-10-CM | POA: Insufficient documentation

## 2016-04-25 DIAGNOSIS — E538 Deficiency of other specified B group vitamins: Secondary | ICD-10-CM | POA: Diagnosis not present

## 2016-04-25 DIAGNOSIS — F03918 Unspecified dementia, unspecified severity, with other behavioral disturbance: Secondary | ICD-10-CM | POA: Diagnosis present

## 2016-04-25 DIAGNOSIS — R7989 Other specified abnormal findings of blood chemistry: Secondary | ICD-10-CM | POA: Diagnosis present

## 2016-04-25 HISTORY — DX: Disorder of kidney and ureter, unspecified: N28.9

## 2016-04-25 LAB — CBC WITH DIFFERENTIAL/PLATELET
BASOS PCT: 1 %
Basophils Absolute: 0.1 10*3/uL (ref 0–0.1)
EOS ABS: 0.2 10*3/uL (ref 0–0.7)
Eosinophils Relative: 2 %
HEMATOCRIT: 43.1 % (ref 40.0–52.0)
Hemoglobin: 14.7 g/dL (ref 13.0–18.0)
Lymphocytes Relative: 17 %
Lymphs Abs: 1.3 10*3/uL (ref 1.0–3.6)
MCH: 32.5 pg (ref 26.0–34.0)
MCHC: 34 g/dL (ref 32.0–36.0)
MCV: 95.8 fL (ref 80.0–100.0)
MONO ABS: 0.6 10*3/uL (ref 0.2–1.0)
MONOS PCT: 8 %
Neutro Abs: 5.5 10*3/uL (ref 1.4–6.5)
Neutrophils Relative %: 72 %
Platelets: 123 10*3/uL — ABNORMAL LOW (ref 150–440)
RBC: 4.51 MIL/uL (ref 4.40–5.90)
RDW: 14.2 % (ref 11.5–14.5)
WBC: 7.7 10*3/uL (ref 3.8–10.6)

## 2016-04-25 LAB — COMPREHENSIVE METABOLIC PANEL
ALBUMIN: 3.6 g/dL (ref 3.5–5.0)
ALK PHOS: 62 U/L (ref 38–126)
ALT: 10 U/L — AB (ref 17–63)
AST: 34 U/L (ref 15–41)
Anion gap: 7 (ref 5–15)
BILIRUBIN TOTAL: 3.1 mg/dL — AB (ref 0.3–1.2)
BUN: 11 mg/dL (ref 6–20)
CO2: 28 mmol/L (ref 22–32)
CREATININE: 1.27 mg/dL — AB (ref 0.61–1.24)
Calcium: 8.8 mg/dL — ABNORMAL LOW (ref 8.9–10.3)
Chloride: 102 mmol/L (ref 101–111)
GFR calc Af Amer: 59 mL/min — ABNORMAL LOW (ref >60)
GFR calc non Af Amer: 51 mL/min — ABNORMAL LOW (ref >60)
GLUCOSE: 84 mg/dL (ref 65–99)
Potassium: 3.5 mmol/L (ref 3.5–5.1)
SODIUM: 137 mmol/L (ref 135–145)
Total Protein: 7.1 g/dL (ref 6.5–8.1)

## 2016-04-25 LAB — ETHANOL: Alcohol, Ethyl (B): <5 mg/dL (ref <5)

## 2016-04-25 LAB — FIBRIN DERIVATIVES D-DIMER (ARMC ONLY): Fibrin derivatives D-dimer (ARMC): 3118 — ABNORMAL HIGH (ref 0–499)

## 2016-04-25 LAB — CK: Total CK: 346 U/L (ref 49–397)

## 2016-04-25 LAB — TROPONIN I: Troponin I: <0.03 ng/mL (ref <0.03)

## 2016-04-25 MED ORDER — IOPAMIDOL (ISOVUE-370) INJECTION 76%
75.0000 mL | Freq: Once | INTRAVENOUS | Status: AC | PRN
  Administered 2016-04-25: 75 mL via INTRAVENOUS

## 2016-04-25 NOTE — ED Notes (Signed)
Pt reports "so called wife" Elease Hashimotoatricia, could come to help, cell # [phone]  Also contact "[facility]", reports "I live there"  Pt intently watching tv, distracted without attentive eye contact

## 2016-04-25 NOTE — ED Notes (Signed)
IT called for computer inop in pt's room

## 2016-04-25 NOTE — ED Provider Notes (Signed)
Time Seen: Approximately *1940 I have reviewed the triage notes  Chief Complaint: Fall   History of Present Illness: Raymond Hughes is a 83 y.o. male who has a history of dementia and atrial fibrillation. Patient had a previous stroke with some residual left-sided hemi-plegia. Patient states that he "" passed out tonight. He states he's not sure what happened he was sitting in a chair and he states he found himself on the floor. He didn't have any current family members or anyone that lives with the patient to verify. Patient denies any focal new weakness at this time. He complains of some upper back discomfort.   Past Medical History:  Diagnosis Date  . Atrial fibrillation (HCC)   . Benign prostatic hypertrophy   . Chronic kidney disease, stage III (moderate)   . Complete edentulism   . Dementia   . Edema   . Hallucinations   . Hemiplegia affecting non-dominant side, post-stroke (HCC)   . Late effect of stroke   . Osteoporosis   . Renal insufficiency   . Special screening for malignant neoplasm of prostate   . Stroke (HCC)   . Unsteady gait   . Vitamin B12 deficiency   . Vitamin D deficiency     Patient Active Problem List   Diagnosis Date Noted  . Dementia with behavioral disturbance 12/11/2015  . Atrial fibrillation (HCC) 12/11/2015  . Circulation problem 12/11/2015  . Hyperlipidemia 07/31/2015  . Cigarette smoker 07/31/2015  . DVT (deep venous thrombosis) (HCC) 07/31/2015  . Positive D dimer 07/30/2015  . Elevated bilirubin 07/30/2015  . Macrocytosis without anemia 07/30/2015  . Thrombocytopenia (HCC) 07/30/2015    Past Surgical History:  Procedure Laterality Date  . cataracts      Past Surgical History:  Procedure Laterality Date  . cataracts      Current Outpatient Rx  . Order #: [id number] Class: Historical Med  . Order #: [id number] Class: Historical Med  . Order #: [id number] Class: Historical Med  . Order #: [id number] Class: Historical Med     Allergies:  Patient has no known allergies.  Family History: Family History  Problem Relation Age of Onset  . Diabetes Mother   . Cancer Daughter   . Diabetes Daughter   . Diabetes Son   . Diabetes Son   . Diabetes Son   . Diabetes Son     Social History: Social History  Substance Use Topics  . Smoking status: Current Every Day Smoker    Packs/day: 1.00    Types: Cigarettes  . Smokeless tobacco: Never Used  . Alcohol use Yes     Comment: Occasionally once a month      Review of Systems:   10 point review of systems was performed and was otherwise negative:  Constitutional: No fever Eyes: No visual disturbances ENT: No sore throat, ear pain Cardiac: No anterior chest pain that he states his pain toward his right flank area. Respiratory: No shortness of breath, wheezing, or stridor Abdomen: No abdominal pain, no vomiting, No diarrhea Endocrine: No weight loss, No night sweats Extremities: No peripheral edema, cyanosis Skin: No rashes, easy bruising Neurologic: No new focal weakness, trouble with speech or swollowing. He seems to have some residual left lower extremity weakness from likely a previous stroke Urologic: No dysuria, Hematuria, or urinary frequency   Physical Exam:  ED Triage Vitals  Enc Vitals Group     BP 04/25/16 2300 129/80     Pulse Rate 04/25/16 2300 72     Resp  04/25/16 2300 18     Temp --      Temp src --      SpO2 04/25/16 2300 96 %     Weight --      Height --      Head Circumference --      Peak Flow --      Pain Score 04/25/16 1943 4     Pain Loc --      Pain Edu? --      Excl. in GC? --     General: Awake , Alert , and Oriented times2 with descriptions of the date not being accurate. He does seem to be aware of his surroundings though a very poor historian. Head: Normal cephalic , atraumatic Eyes: Pupils equal , round, reactive to light Nose/Throat: No nasal drainage, patent upper airway without erythema or exudate.   Neck: Supple, Full range of motion, No anterior adenopathy or palpable thyroid masses Lungs: Clear to ascultation without wheezes , rhonchi, or rales Heart: Regular rate, irregular rhythm without murmurs , gallops , or rubs Abdomen: Soft, non tender without rebound, guarding , or rigidity; bowel sounds positive and symmetric in all 4 quadrants. No organomegaly .        Extremities: 2 plus symmetric pulses. No edema, clubbing or cyanosis. No reproducible pain at the hip. No pelvic discomfort Neurologic: Left lower extremity weakness but otherwise no focal abnormalities Skin: warm, dry, no rashes   Labs:   All laboratory work was reviewed including any pertinent negatives or positives listed below:  Labs Reviewed  CBC WITH DIFFERENTIAL/PLATELET - Abnormal; Notable for the following:       Result Value   Platelets 123 (*)    All other components within normal limits  COMPREHENSIVE METABOLIC PANEL - Abnormal; Notable for the following:    Creatinine, Ser 1.27 (*)    Calcium 8.8 (*)    ALT 10 (*)    Total Bilirubin 3.1 (*)    GFR calc non Af Amer 51 (*)    GFR calc Af Amer 59 (*)    All other components within normal limits  FIBRIN DERIVATIVES D-DIMER ([facility] ONLY) - Abnormal; Notable for the following:    Fibrin derivatives D-dimer [facility]) 3,118 (*)    All other components within normal limits  CK  TROPONIN I  ETHANOL  D-dimer test is significantly elevated once again.  EKG:  ED ECG REPORT I, Jennye Moccasin, the attending physician, personally viewed and interpreted this ECG.  Date: 04/25/2016 EKG Time: 2339Rate: 64 Rhythm: Atrial fibrillation with occasional PVC QRS Axis: normal Intervals: normal ST/T Wave abnormalities: normal Conduction Disturbances: none Narrative Interpretation: unremarkable Poor R-wave progression in the septal leads Left axis deviation likely intrafascicular block No obvious acute ischemic changes   Radiology:  "Dg Chest 2 View  Result Date:  04/25/2016 CLINICAL DATA:  Initial evaluation for acute trauma, fall right-sided pain. EXAM: CHEST  2 VIEW COMPARISON:  Prior radiograph from/ 4/17. FINDINGS: Mild cardiomegaly, stable. Mediastinal silhouette within normal limits. Aortic atherosclerosis noted. Moderate size hiatal hernia noted as well. Lungs mildly hypoinflated. Chronic coarsening of the interstitial markings present. No focal infiltrates. No pulmonary edema or pleural effusion. No pneumothorax. No acute osseous abnormality. Chronic compression deformity within the lower thoracic spine is similar to previous. Osteopenia noted. IMPRESSION: 1. Chronic coarsening of the interstitial markings with no active cardiopulmonary disease identified. 2. Hiatal hernia. 3. Chronic compression deformity within the lower thoracic spine, similar from previous. 4. Aortic atherosclerosis.  Electronically Signed   By: Rise Mu M.D.   On: 04/25/2016 20:58   Dg Thoracic Spine 2 View  Result Date: 04/25/2016 CLINICAL DATA:  Initial evaluation for acute mid back pain status post fall. EXAM: THORACIC SPINE 2 VIEWS COMPARISON:  Prior radiograph from 08/01/2015. FINDINGS: Dextroscoliosis with mild accentuation of the normal thoracic kyphosis. No listhesis or malalignment. Chronic anterior wedging deformity involving the T12 vertebral body, grossly similar to previous studies. No definite acute component. Vertebral body heights otherwise maintained. No other acute fracture identified. Visualized soft tissues demonstrate no acute abnormality. Aortic atherosclerosis noted. Hiatal hernia noted as well. IMPRESSION: 1. No radiographic evidence for acute traumatic injury within the thoracic spine. 2. Chronic compression deformity of the T12 vertebral body, grossly stable from previous. 3. Dextroscoliosis. Electronically Signed   By: Rise Mu M.D.   On: 04/25/2016 21:02   Ct Angio Chest Pe W Or Wo Contrast  Result Date: 04/25/2016 CLINICAL DATA:   Patient status post fall. EXAM: CT ANGIOGRAPHY CHEST WITH CONTRAST TECHNIQUE: Multidetector CT imaging of the chest was performed using the standard protocol during bolus administration of intravenous contrast. Multiplanar CT image reconstructions and MIPs were obtained to evaluate the vascular anatomy. CONTRAST:  75 cc Isovue 370 COMPARISON:  Chest radiograph earlier same date. FINDINGS: Cardiovascular: Normal heart size. No pericardial effusion. Aorta and main pulmonary artery are normal in caliber. Coronary arterial vascular calcifications. Vascular calcifications involving the thoracic aorta. Adequate opacification of the pulmonary arterial system. Motion artifact limits evaluation. No evidence for central, main or lobar pulmonary emboli. Mediastinum/Nodes: Moderate size hiatal hernia. No enlarged axillary, mediastinal or hilar lymphadenopathy. Lungs/Pleura: Probable dependent mucus within the trachea. Centrilobular and paraseptal emphysematous change. Dependent atelectasis within the lower lobes bilaterally. No pleural effusion or pneumothorax. Upper Abdomen: Unremarkable. Musculoskeletal: Thoracic spine degenerative changes. Lower thoracic spine compression deformity, likely chronic as this appears stable when compared to prior chest radiographs. No aggressive or acute appearing osseous lesions. Review of the MIP images confirms the above findings. IMPRESSION: Evaluation limited due to motion artifact. Within the above limitation no evidence for central, main, lobar or segmental pulmonary embolus. No acute process within the chest. Likely chronic compression deformity involving the lower thoracic spine vertebral body. Recommend correlation with point tenderness. Aortic atherosclerosis. Electronically Signed   By: Annia Belt M.D.   On: 04/25/2016 22:57  "  I personally reviewed the radiologic studies   P  ED Course:  Patient's stay here was uneventful and the patient's otherwise hemodynamically  stable. The patient had a syncopal episode and is unknown how long he was lying on the floor. We will also had difficulty in trying to obtain family members or anyone that lives with the patient as he will arrive by EMS. I did establish a social consult patient has a history of dementia and what seems to be some poor medical compliance and I'm not sure of his health care at home. Chest CT did not show any evidence of fractures, pulmonary embolism, or any other intrathoracic concerns. He does not appear to have suffered any significant intracranial injury with no evidence of head trauma      Final Clinical Impression:  Final diagnoses:  Syncope  Syncope and collapse     Plan:  Inpatient            Jennye Moccasin, MD 04/26/16 0000

## 2016-04-25 NOTE — ED Triage Notes (Signed)
Larey SeatFell today at home resulting in low back pain. Pt states doesn't know what happened, just "found myself on the floor".

## 2016-04-25 NOTE — ED Notes (Signed)
Contact with wife: "I'll have his daughter call you back"

## 2016-04-25 NOTE — ED Notes (Signed)
Patient transported to CT 

## 2016-04-25 NOTE — ED Notes (Signed)
Patient transported to X-ray 

## 2016-04-26 ENCOUNTER — Observation Stay
Admit: 2016-04-26 | Discharge: 2016-04-26 | Disposition: A | Discharge status: Home / Self Care | Payer: Medicare (Managed Care) | Attending: Internal Medicine | Admitting: Internal Medicine

## 2016-04-26 DIAGNOSIS — N183 Chronic kidney disease, stage 3 unspecified: Secondary | ICD-10-CM | POA: Diagnosis present

## 2016-04-26 DIAGNOSIS — R55 Syncope and collapse: Secondary | ICD-10-CM | POA: Diagnosis present

## 2016-04-26 LAB — CBC
HEMATOCRIT: 37.7 % — AB (ref 40.0–52.0)
HEMOGLOBIN: 13.2 g/dL (ref 13.0–18.0)
MCH: 33.1 pg (ref 26.0–34.0)
MCHC: 35 g/dL (ref 32.0–36.0)
MCV: 94.6 fL (ref 80.0–100.0)
Platelets: 108 10*3/uL — ABNORMAL LOW (ref 150–440)
RBC: 3.98 MIL/uL — ABNORMAL LOW (ref 4.40–5.90)
RDW: 14.3 % (ref 11.5–14.5)
WBC: 5.7 10*3/uL (ref 3.8–10.6)

## 2016-04-26 LAB — URINALYSIS, COMPLETE (UACMP) WITH MICROSCOPIC
BACTERIA UA: NONE SEEN
Bilirubin Urine: NEGATIVE
GLUCOSE, UA: NEGATIVE mg/dL
Ketones, ur: 5 mg/dL — AB
LEUKOCYTES UA: NEGATIVE
Nitrite: NEGATIVE
PH: 7 (ref 5.0–8.0)
Protein, ur: NEGATIVE mg/dL
SPECIFIC GRAVITY, URINE: 1.036 — AB (ref 1.005–1.030)
SQUAMOUS EPITHELIAL / LPF: NONE SEEN

## 2016-04-26 LAB — TROPONIN I: Troponin I: <0.03 ng/mL (ref <0.03)

## 2016-04-26 LAB — ECHOCARDIOGRAM COMPLETE

## 2016-04-26 LAB — BASIC METABOLIC PANEL
ANION GAP: 7 (ref 5–15)
BUN: 12 mg/dL (ref 6–20)
CHLORIDE: 107 mmol/L (ref 101–111)
CO2: 26 mmol/L (ref 22–32)
Calcium: 8.6 mg/dL — ABNORMAL LOW (ref 8.9–10.3)
Creatinine, Ser: 1.22 mg/dL (ref 0.61–1.24)
GFR calc Af Amer: >60 mL/min (ref >60)
GFR calc non Af Amer: 54 mL/min — ABNORMAL LOW (ref >60)
GLUCOSE: 80 mg/dL (ref 65–99)
POTASSIUM: 4 mmol/L (ref 3.5–5.1)
Sodium: 140 mmol/L (ref 135–145)

## 2016-04-26 MED ORDER — ONDANSETRON HCL 4 MG/2ML IJ SOLN: 4.0000 mg | Freq: Four times a day (QID) | INTRAMUSCULAR | Status: DC | PRN

## 2016-04-26 MED ORDER — SODIUM CHLORIDE 0.9 % IV SOLN
INTRAVENOUS | Status: AC
  Administered 2016-04-26: 03:00:00 via INTRAVENOUS

## 2016-04-26 MED ORDER — ACETAMINOPHEN 325 MG PO TABS: 650.0000 mg | ORAL_TABLET | Freq: Four times a day (QID) | ORAL | Status: DC | PRN

## 2016-04-26 MED ORDER — ONDANSETRON HCL 4 MG PO TABS: 4.0000 mg | ORAL_TABLET | Freq: Four times a day (QID) | ORAL | Status: DC | PRN

## 2016-04-26 MED ORDER — ACETAMINOPHEN 650 MG RE SUPP: 650.0000 mg | Freq: Four times a day (QID) | RECTAL | Status: DC | PRN

## 2016-04-26 MED ORDER — SODIUM CHLORIDE 0.9% FLUSH
3.0000 mL | Freq: Two times a day (BID) | INTRAVENOUS | Status: DC
  Administered 2016-04-26 (×2): 3 mL via INTRAVENOUS

## 2016-04-26 MED ORDER — ASPIRIN EC 81 MG PO TBEC
81.0000 mg | DELAYED_RELEASE_TABLET | Freq: Every day | ORAL | Status: DC
  Administered 2016-04-26: 81 mg via ORAL
  Filled 2016-04-26: qty 1

## 2016-04-26 MED ORDER — ENOXAPARIN SODIUM 40 MG/0.4ML ~~LOC~~ SOLN: 40.0000 mg | SUBCUTANEOUS | Status: DC

## 2016-04-26 NOTE — Progress Notes (Signed)
Patient is admitted to room 254 with the diagnosis of syncope. Alert and oriented x4. Denied any acute pain. No known respiratory distress noted. Tele box called to CCMD with Susa RaringLisa RN as a second Training and development officerverifier. Skin assessment done with Misty StanleyLisa as well, skin dry and intact to touch. Noted dry flaky feet. Fall risk assessment reviewed. Patient indicated he'll sign the fall contract when he wakes up. Patient voiced no concerns. Will continue to monitor.

## 2016-04-26 NOTE — Care Management Note (Addendum)
Case Management Note  Patient Details  Name: Raymond Hughes MRN: [id number] Date of Birth: 11/10/1932  Subjective/Objective:      [name] is Hughes patient of [facility]. Discussed discharge planning with the weekend Swer. No current plans for SNF. This Clinical research associatewriter called [facility] and updated the on-call RN about discharge home today. Contacted daughter Raymond Hughes by phone. Ms Raymond Hughes will transport [name] home this afternoon. No changes in medications. No new discharge needs identified.               Action/Plan:   Expected Discharge Date:  04/26/16               Expected Discharge Plan:     In-House Referral:     Discharge planning Services     Post Acute Care Choice:    Choice offered to:     DME Arranged:    DME Agency:     HH Arranged:    HH Agency:     Status of Service:     If discussed at [employer] of [employer] Meetings, dates discussed:    Additional Comments:  Raymond Porto A, RN 04/26/2016, 12:49 PM

## 2016-04-26 NOTE — H&P (Signed)
[facility] - [place] at [facility]   PATIENT NAME: Raymond Hughes    MR#:  [id number]  DATE OF BIRTH:  1932-05-20  DATE OF ADMISSION:  04/25/2016  PRIMARY CARE PHYSICIAN: Delton Prairie, FNP   REQUESTING/REFERRING PHYSICIAN: Huel Cote, MD  CHIEF COMPLAINT:   Chief Complaint  Patient presents with  . Fall    HISTORY OF PRESENT ILLNESS:  Raymond Hughes  is a 83 y.o. male who presents with Complaint of an episode of loss of consciousness. Patient states that he woke up on his floor, does not remember how he got there. He denies any bowel or bladder incontinence. He lives at home by himself in this event was unwitnessed. He states that he knew where he was pretty quickly after he woke up, and called EMS to come in for evaluation. Here his workup was largely within normal limits. Patient does have a history of dementia, and his story is somewhat inconsistent as he states that he had this episode of unconsciousness, but then he states that perhaps he was sleeping on his sofa rolled off.  PAST MEDICAL HISTORY:   Past Medical History:  Diagnosis Date  . Atrial fibrillation (HCC)   . Benign prostatic hypertrophy   . Chronic kidney disease, stage III (moderate)   . Complete edentulism   . Dementia   . Edema   . Hallucinations   . Hemiplegia affecting non-dominant side, post-stroke (HCC)   . Late effect of stroke   . Osteoporosis   . Renal insufficiency   . Special screening for malignant neoplasm of prostate   . Stroke (HCC)   . Unsteady gait   . Vitamin B12 deficiency   . Vitamin D deficiency     PAST SURGICAL HISTORY:   Past Surgical History:  Procedure Laterality Date  . cataracts      SOCIAL HISTORY:   Social History  Substance Use Topics  . Smoking status: Current Every Day Smoker    Packs/day: 1.00    Types: Cigarettes  . Smokeless tobacco: Never Used  . Alcohol use Yes     Comment: Occasionally once a month     FAMILY HISTORY:    Family History  Problem Relation Age of Onset  . Diabetes Mother   . Cancer Daughter   . Diabetes Daughter   . Diabetes Son   . Diabetes Son   . Diabetes Son   . Diabetes Son     DRUG ALLERGIES:  No Known Allergies  MEDICATIONS AT HOME:   Prior to Admission medications   Medication Sig Start Date End Date Taking? Authorizing Provider  acetaminophen (TYLENOL) 325 MG tablet Take 325-650 mg by mouth every 6 (six) hours as needed for mild pain or headache.   Yes Historical Provider, MD  aspirin EC 81 MG tablet Take 81 mg by mouth daily.   Yes Historical Provider, MD  cholecalciferol (VITAMIN D) 1000 units tablet Take 1,000 Units by mouth daily.   Yes Historical Provider, MD  vitamin B-12 (CYANOCOBALAMIN) 1000 MCG tablet Take 1,000 mcg by mouth daily.   Yes Historical Provider, MD    REVIEW OF SYSTEMS:  Review of Systems  Constitutional: Negative for chills, fever, malaise/fatigue and weight loss.  HENT: Negative for ear pain, hearing loss and tinnitus.   Eyes: Negative for blurred vision, double vision, pain and redness.  Respiratory: Negative for cough, hemoptysis and shortness of breath.   Cardiovascular: Negative for chest pain, palpitations, orthopnea and leg swelling.  Gastrointestinal: Negative  for abdominal pain, constipation, diarrhea, nausea and vomiting.  Genitourinary: Negative for dysuria, frequency and hematuria.  Musculoskeletal: Negative for back pain, joint pain and neck pain.  Skin:       No acne, rash, or lesions  Neurological: Positive for loss of consciousness. Negative for dizziness, tremors, focal weakness and weakness.  Endo/Heme/Allergies: Negative for polydipsia. Does not bruise/bleed easily.  Psychiatric/Behavioral: Negative for depression. The patient is not nervous/anxious and does not have insomnia.      VITAL SIGNS:   Vitals:   04/25/16 2300  BP: 129/80  Pulse: 72  Resp: 18  SpO2: 96%   Wt Readings from Last 3 Encounters:  01/07/16 64  kg (141 lb)  12/11/15 63.5 kg (140 lb)  07/31/15 62.4 kg (137 lb 9.6 oz)    PHYSICAL EXAMINATION:  Physical Exam  Vitals reviewed. Constitutional: He is oriented to person, place, and time. He appears well-developed and well-nourished. No distress.  HENT:  Head: Normocephalic and atraumatic.  Mouth/Throat: Oropharynx is clear and moist.  Eyes: Conjunctivae and EOM are normal. Pupils are equal, round, and reactive to light. No scleral icterus.  Neck: Normal range of motion. Neck supple. No JVD present. No thyromegaly present.  Cardiovascular: Normal rate, regular rhythm and intact distal pulses.  Exam reveals no gallop and no friction rub.   No murmur heard. Respiratory: Effort normal and breath sounds normal. No respiratory distress. He has no wheezes. He has no rales.  GI: Soft. Bowel sounds are normal. He exhibits no distension. There is no tenderness.  Musculoskeletal: Normal range of motion. He exhibits no edema.  No arthritis, no gout  Lymphadenopathy:    He has no cervical adenopathy.  Neurological: He is alert and oriented to person, place, and time. No cranial nerve deficit.  Patient is oriented to person place and time, though he is somewhat inconsistent when it comes to taking his history  Skin: Skin is warm and dry. No rash noted. No erythema.  Psychiatric: He has a normal mood and affect. His behavior is normal. Judgment and thought content normal.    LABORATORY PANEL:   CBC  Recent Labs Lab 04/25/16 2012  WBC 7.7  HGB 14.7  HCT 43.1  PLT 123*   ------------------------------------------------------------------------------------------------------------------  Chemistries   Recent Labs Lab 04/25/16 2012  NA 137  K 3.5  CL 102  CO2 28  GLUCOSE 84  BUN 11  CREATININE 1.27*  CALCIUM 8.8*  AST 34  ALT 10*  ALKPHOS 62  BILITOT 3.1*    ------------------------------------------------------------------------------------------------------------------  Cardiac Enzymes  Recent Labs Lab 04/25/16 2012  TROPONINI <0.03   ------------------------------------------------------------------------------------------------------------------  RADIOLOGY:  Dg Chest 2 View  Result Date: 04/25/2016 CLINICAL DATA:  Initial evaluation for acute trauma, fall right-sided pain. EXAM: CHEST  2 VIEW COMPARISON:  Prior radiograph from/ 4/17. FINDINGS: Mild cardiomegaly, stable. Mediastinal silhouette within normal limits. Aortic atherosclerosis noted. Moderate size hiatal hernia noted as well. Lungs mildly hypoinflated. Chronic coarsening of the interstitial markings present. No focal infiltrates. No pulmonary edema or pleural effusion. No pneumothorax. No acute osseous abnormality. Chronic compression deformity within the lower thoracic spine is similar to previous. Osteopenia noted. IMPRESSION: 1. Chronic coarsening of the interstitial markings with no active cardiopulmonary disease identified. 2. Hiatal hernia. 3. Chronic compression deformity within the lower thoracic spine, similar from previous. 4. Aortic atherosclerosis. Electronically Signed   By: Rise Mu M.D.   On: 04/25/2016 20:58   Dg Thoracic Spine 2 View  Result Date: 04/25/2016 CLINICAL DATA:  Initial evaluation for acute mid back pain status post fall. EXAM: THORACIC SPINE 2 VIEWS COMPARISON:  Prior radiograph from 08/01/2015. FINDINGS: Dextroscoliosis with mild accentuation of the normal thoracic kyphosis. No listhesis or malalignment. Chronic anterior wedging deformity involving the T12 vertebral body, grossly similar to previous studies. No definite acute component. Vertebral body heights otherwise maintained. No other acute fracture identified. Visualized soft tissues demonstrate no acute abnormality. Aortic atherosclerosis noted. Hiatal hernia noted as well. IMPRESSION:  1. No radiographic evidence for acute traumatic injury within the thoracic spine. 2. Chronic compression deformity of the T12 vertebral body, grossly stable from previous. 3. Dextroscoliosis. Electronically Signed   By: Rise MuBenjamin  McClintock M.D.   On: 04/25/2016 21:02   Ct Angio Chest Pe W Or Wo Contrast  Result Date: 04/25/2016 CLINICAL DATA:  Patient status post fall. EXAM: CT ANGIOGRAPHY CHEST WITH CONTRAST TECHNIQUE: Multidetector CT imaging of the chest was performed using the standard protocol during bolus administration of intravenous contrast. Multiplanar CT image reconstructions and MIPs were obtained to evaluate the vascular anatomy. CONTRAST:  75 cc Isovue 370 COMPARISON:  Chest radiograph earlier same date. FINDINGS: Cardiovascular: Normal heart size. No pericardial effusion. Aorta and main pulmonary artery are normal in caliber. Coronary arterial vascular calcifications. Vascular calcifications involving the thoracic aorta. Adequate opacification of the pulmonary arterial system. Motion artifact limits evaluation. No evidence for central, main or lobar pulmonary emboli. Mediastinum/Nodes: Moderate size hiatal hernia. No enlarged axillary, mediastinal or hilar lymphadenopathy. Lungs/Pleura: Probable dependent mucus within the trachea. Centrilobular and paraseptal emphysematous change. Dependent atelectasis within the lower lobes bilaterally. No pleural effusion or pneumothorax. Upper Abdomen: Unremarkable. Musculoskeletal: Thoracic spine degenerative changes. Lower thoracic spine compression deformity, likely chronic as this appears stable when compared to prior chest radiographs. No aggressive or acute appearing osseous lesions. Review of the MIP images confirms the above findings. IMPRESSION: Evaluation limited due to motion artifact. Within the above limitation no evidence for central, main, lobar or segmental pulmonary embolus. No acute process within the chest. Likely chronic compression  deformity involving the lower thoracic spine vertebral body. Recommend correlation with point tenderness. Aortic atherosclerosis. Electronically Signed   By: Annia Beltrew  Davis M.D.   On: 04/25/2016 22:57    EKG:   Orders placed or performed during the hospital encounter of 04/25/16  . ED EKG  . ED EKG    IMPRESSION AND PLAN:  Principal Problem:   Syncope - trend cardiac enzymes tonight, get echocardiogram in morning a cardiology consult. Active Problems:   Positive D dimer - patient seems to have chronically elevated d-dimer, apparently was recently treated here for DVT that he is unable to give more information about treatment for this and whether or not he still wanted.   Dementia with behavioral disturbance - patient states lives by himself, social worker consult placed to help determine his social setting and need for any interventions   Atrial fibrillation (HCC) - continue home meds   Hyperlipidemia - home dose anti-lipids   CKD (chronic kidney disease), stage III - stable, baseline, avoid nephrotoxins and monitor  All the records are reviewed and case discussed with ED provider. Management plans discussed with the patient and/or family.  DVT PROPHYLAXIS: SubQ lovenox  GI PROPHYLAXIS: None  ADMISSION STATUS: Observation  CODE STATUS: Full Code Status History    Date Active Date Inactive Code Status Order ID Comments User Context   07/31/2015  6:00 PM 08/01/2015 11:26 PM Full Code [id number]  Auburn BilberryShreyang Patel, MD ED  TOTAL TIME TAKING CARE OF THIS PATIENT: 40 minutes.    Caren Garske FIELDING 04/26/2016, 12:13 AM  [facility]  Office  [phone]  CC: Primary care physician; Delton Prairie, FNP

## 2016-04-26 NOTE — Progress Notes (Signed)
*  PRELIMINARY RESULTS* Echocardiogram 2D Echocardiogram has been performed.  Raymond Hughes 04/26/2016, 9:10 AM

## 2016-04-26 NOTE — ED Notes (Signed)
Dr Lynford CitizenKagel, [facility], called for update to pt status and admitting dx

## 2016-04-26 NOTE — Progress Notes (Signed)
Pt admitted with fall of unclear etiology. No obvious syncope. Telemetry revealed no arrhythmia. Echo unremarkable. OK for discharge. Will see as outpatient if desired. Full note to follow . Ok for dicharge form cardiac standpoint.

## 2016-04-26 NOTE — Progress Notes (Signed)
Rounded with MD at bedside. Patient alert and oriented, denies any pain , patient updated about his condition, patient is being discharge home to self, awaiting for daughter to pick up patient at this time .

## 2016-04-26 NOTE — Clinical Social Work Note (Addendum)
CSW received 3 separate consults for patient to be placed. [facility] is following this patient and called to alert the CSW that patient will not be placed and is followed by [facility] at home with significant attention from his family. She also reported that his family does not want him placed.  Argentina PonderKaren Martha Tonetta Napoles, MSW, Theresia MajorsLCSWA [phone]

## 2016-04-27 NOTE — Discharge Summary (Signed)
[facility] [place] [facility]   PATIENT NAME: Raymond Hughes    MR#:  [id number]  DATE OF BIRTH:  [birth year]  DATE OF ADMISSION:  04/25/2016   ADMITTING PHYSICIAN: Oralia Manis, MD  DATE OF DISCHARGE: 04/26/2016  2:04 PM  PRIMARY CARE PHYSICIAN: Delton Prairie, FNP   ADMISSION DIAGNOSIS:   Syncope and collapse [R55] Syncope [R55]  DISCHARGE DIAGNOSIS:   Principal Problem:   Syncope Active Problems:   Positive D dimer   Hyperlipidemia   Dementia with behavioral disturbance   Atrial fibrillation (HCC)   CKD (chronic kidney disease), stage III   SECONDARY DIAGNOSIS:   Past Medical History:  Diagnosis Date  . Atrial fibrillation (HCC)   . Benign prostatic hypertrophy   . Chronic kidney disease, stage III (moderate)   . Complete edentulism   . Dementia   . Edema   . Hallucinations   . Hemiplegia affecting non-dominant side, post-stroke (HCC)   . Late effect of stroke   . Osteoporosis   . Renal insufficiency   . Special screening for malignant neoplasm of prostate   . Stroke (HCC)   . Unsteady gait   . Vitamin B12 deficiency   . Vitamin D deficiency     HOSPITAL COURSE:   83 year old male with past medical history significant for dementia, A. fib, CK D, hypertension, history of stroke presenting from home secondary to syncopal episode.  #1 syncope-likely fall related to confusion or vasovagal. -Monitor on telemetry without any acute findings -No evidence of any infection -Echocardiogram is normal with EF of 60-65% -CT chest on admission did not show any acute PE findings - patient is back to baseline. Will need fall precautions at bedtime  #2 vitamin D deficiency-continue vitamin D supplements  #3 dementia-pleasantly confused and confabulates. Occasional behavioral disturbance present. Seems to be at baseline presently.  Being discharged home. Patient will be followed by [facility]  DISCHARGE CONDITIONS:    Guarded  CONSULTS OBTAINED:   Treatment Team:  Dalia Heading, MD  DRUG ALLERGIES:   No Known Allergies DISCHARGE MEDICATIONS:   Allergies as of 04/26/2016   No Known Allergies     Medication List    TAKE these medications   acetaminophen 325 MG tablet Commonly known as:  TYLENOL Take 325-650 mg by mouth every 6 (six) hours as needed for mild pain or headache.   aspirin EC 81 MG tablet Take 81 mg by mouth daily.   cholecalciferol 1000 units tablet Commonly known as:  VITAMIN D Take 1,000 Units by mouth daily.   vitamin B-12 1000 MCG tablet Commonly known as:  CYANOCOBALAMIN Take 1,000 mcg by mouth daily.        DISCHARGE INSTRUCTIONS:   1. PCP f/u in 1-2 weeks  DIET:   Cardiac diet  ACTIVITY:   Activity as tolerated  OXYGEN:   Home Oxygen: No.  Oxygen Delivery: room air  DISCHARGE LOCATION:   home   If you experience worsening of your admission symptoms, develop shortness of breath, life threatening emergency, suicidal or homicidal thoughts you must seek medical attention immediately by calling 911 or calling your MD immediately  if symptoms less severe.  You Must read complete instructions/literature along with all the possible adverse reactions/side effects for all the Medicines you take and that have been prescribed to you. Take any new Medicines after you have completely understood and accpet all the possible adverse reactions/side effects.   Please note  You were cared for  by a hospitalist during your hospital stay. If you have any questions about your discharge medications or the care you received while you were in the hospital after you are discharged, you can call the unit and asked to speak with the hospitalist on call if the hospitalist that took care of you is not available. Once you are discharged, your primary care physician will handle any further medical issues. Please note that NO REFILLS for any discharge medications will be  authorized once you are discharged, as it is imperative that you return to your primary care physician (or establish a relationship with a primary care physician if you do not have one) for your aftercare needs so that they can reassess your need for medications and monitor your lab values.    On the day of Discharge:  VITAL SIGNS:   Blood pressure 111/66, pulse (!) 51, temperature 98.3 F (36.8 C), temperature source Oral, resp. rate 18, SpO2 95 %.  PHYSICAL EXAMINATION:    GENERAL:  83 y.o.-year-old patient lying in the bed with no acute distress.  EYES: Pupils equal, round, reactive to light and accommodation. No scleral icterus. Extraocular muscles intact.  HEENT: Head atraumatic, normocephalic. Oropharynx and nasopharynx clear.  NECK:  Supple, no jugular venous distention. No thyroid enlargement, no tenderness.  LUNGS: Normal breath sounds bilaterally, no wheezing, rales,rhonchi or crepitation. No use of accessory muscles of respiration. Decreased bibasilar breath sounds CARDIOVASCULAR: S1, S2 normal. No rubs, or gallops. 2/6 systolic murmur present. ABDOMEN: Soft, non-tender, non-distended. Bowel sounds present. No organomegaly or mass.  EXTREMITIES: No pedal edema, cyanosis, or clubbing.  NEUROLOGIC: Cranial nerves II through XII are intact. Muscle strength 5/5 in all extremities. Sensation intact. Gait not checked.  PSYCHIATRIC: The patient is alert and oriented to self.Marland Kitchen  SKIN: No obvious rash, lesion, or ulcer.   DATA REVIEW:   CBC  Recent Labs Lab 04/26/16 0750  WBC 5.7  HGB 13.2  HCT 37.7*  PLT 108*    Chemistries   Recent Labs Lab 04/25/16 2012 04/26/16 0750  NA 137 140  K 3.5 4.0  CL 102 107  CO2 28 26  GLUCOSE 84 80  BUN 11 12  CREATININE 1.27* 1.22  CALCIUM 8.8* 8.6*  AST 34  --   ALT 10*  --   ALKPHOS 62  --   BILITOT 3.1*  --      Microbiology Results  Results for orders placed or performed in visit on 11/02/13  Urine culture     Status:  None   Collection Time: 11/03/13 12:18 AM  Result Value Ref Range Status   Micro Text Report   Final       SOURCE: CLEAN CATCH    COMMENT                   MIXED BACTERIAL ORGANISMS   COMMENT                   RESULTS SUGGESTIVE OF CONTAMINATION   ANTIBIOTIC                                                        RADIOLOGY:  No results found.   Management plans discussed with the patient, family and they are in agreement.  CODE STATUS:  Code Status History  Date Active Date Inactive Code Status Order ID Comments User Context   04/26/2016  2:25 AM 04/26/2016  5:12 PM Full Code [id number]  Oralia Manisavid Willis, MD Inpatient   07/31/2015  6:00 PM 08/01/2015 11:26 PM Full Code [id number]  Auburn BilberryShreyang Patel, MD ED      TOTAL TIME TAKING CARE OF THIS PATIENT: 37 minutes.    Enid BaasKALISETTI,Harim Bi M.D on 04/27/2016 at 3:54 PM  Between 7am to 6pm - Pager - [phone]  After 6pm go to [email or web address] - Social research officer, governmentpassword [email or web address]  [facility]  Office  [phone]  CC: Primary care physician; Delton PrairieBROOKS, KEATAH B, FNP   Note: This dictation was prepared with Dragon dictation along with smaller phrase technology. Any transcriptional errors that result from this process are unintentional.

## 2016-05-04 ENCOUNTER — Emergency Department: Payer: Medicare (Managed Care)

## 2016-05-04 ENCOUNTER — Emergency Department
Admission: EM | Admit: 2016-05-04 | Discharge: 2016-05-04 | Disposition: A | Discharge status: Home / Self Care | Payer: Medicare (Managed Care) | Attending: Emergency Medicine | Admitting: Emergency Medicine

## 2016-05-04 DIAGNOSIS — Y999 Unspecified external cause status: Secondary | ICD-10-CM | POA: Insufficient documentation

## 2016-05-04 DIAGNOSIS — S32020S Wedge compression fracture of second lumbar vertebra, sequela: Secondary | ICD-10-CM | POA: Diagnosis not present

## 2016-05-04 DIAGNOSIS — Y92009 Unspecified place in unspecified non-institutional (private) residence as the place of occurrence of the external cause: Secondary | ICD-10-CM | POA: Diagnosis not present

## 2016-05-04 DIAGNOSIS — Y939 Activity, unspecified: Secondary | ICD-10-CM | POA: Diagnosis not present

## 2016-05-04 DIAGNOSIS — Z79899 Other long term (current) drug therapy: Secondary | ICD-10-CM | POA: Diagnosis not present

## 2016-05-04 DIAGNOSIS — S3992XA Unspecified injury of lower back, initial encounter: Secondary | ICD-10-CM | POA: Diagnosis present

## 2016-05-04 DIAGNOSIS — M545 Low back pain, unspecified: Secondary | ICD-10-CM

## 2016-05-04 DIAGNOSIS — W1839XA Other fall on same level, initial encounter: Secondary | ICD-10-CM | POA: Diagnosis not present

## 2016-05-04 DIAGNOSIS — N183 Chronic kidney disease, stage 3 (moderate): Secondary | ICD-10-CM | POA: Insufficient documentation

## 2016-05-04 DIAGNOSIS — Z8673 Personal history of transient ischemic attack (TIA), and cerebral infarction without residual deficits: Secondary | ICD-10-CM | POA: Insufficient documentation

## 2016-05-04 DIAGNOSIS — S32050A Wedge compression fracture of fifth lumbar vertebra, initial encounter for closed fracture: Secondary | ICD-10-CM | POA: Diagnosis not present

## 2016-05-04 MED ORDER — TRAMADOL HCL 50 MG PO TABS
50.0000 mg | ORAL_TABLET | Freq: Once | ORAL | Status: AC
  Administered 2016-05-04: 50 mg via ORAL
  Filled 2016-05-04: qty 1

## 2016-05-04 NOTE — Discharge Instructions (Signed)
There is a compression fracture at L5 that is new from 2015, but does not appear to be from the fall today. There are other compression fractures in the thoracic and lumbar spine that are old based on previous x-rays.  Follow up with the orthopedic doctor and primary care provider. Return to the ER for symptoms that change or worsen. Take tylenol every 6-8 hours for pain.

## 2016-05-04 NOTE — ED Triage Notes (Signed)
Pt brought in by ems for report of fall - he states he fell onto his knees - c/o lower back pain for the last week and was seen in the ER last week for the same pain in lower back - denies hitting head - denies dizziness

## 2016-05-04 NOTE — ED Notes (Signed)
Patient presents to the ED post fall per patient's provider Durel SaltsKeatah Brooks at [facility].  Ms. Shon BatonBrooks number is [phone].  Per Ms. Brooks patient fell at home with home health aide and patient fell onto his knees.

## 2016-05-04 NOTE — ED Provider Notes (Signed)
[facility] Emergency Department Provider Note ____________________________________________  Time seen: Approximately 4:56 PM  I have reviewed the triage vital signs and the nursing notes.   HISTORY  Chief Complaint Back Pain    HPI Raymond Hughes is a 83 y.o. male who presents to the emergency department for evaluation of back pain. He states that he was attempting to get out of his chair, lost his balance, and fell his knees. He states that he has had back pain for a long time, but since his fall today he is hurting worse. He states that he has chronic pain in his left knee but it is no worse today since his fall. He has had Tylenol with no relief. He denies loss of consciousness or dizziness. He is a [facility] patient.  Past Medical History:  Diagnosis Date  . Atrial fibrillation (HCC)   . Benign prostatic hypertrophy   . Chronic kidney disease, stage III (moderate)   . Complete edentulism   . Dementia   . Edema   . Hallucinations   . Hemiplegia affecting non-dominant side, post-stroke (HCC)   . Late effect of stroke   . Osteoporosis   . Renal insufficiency   . Special screening for malignant neoplasm of prostate   . Stroke (HCC)   . Unsteady gait   . Vitamin B12 deficiency   . Vitamin D deficiency     Patient Active Problem List   Diagnosis Date Noted  . Syncope 04/26/2016  . CKD (chronic kidney disease), stage III 04/26/2016  . Dementia with behavioral disturbance 12/11/2015  . Atrial fibrillation (HCC) 12/11/2015  . Circulation problem 12/11/2015  . Hyperlipidemia 07/31/2015  . Cigarette smoker 07/31/2015  . DVT (deep venous thrombosis) (HCC) 07/31/2015  . Positive D dimer 07/30/2015  . Elevated bilirubin 07/30/2015  . Macrocytosis without anemia 07/30/2015  . Thrombocytopenia (HCC) 07/30/2015    Past Surgical History:  Procedure Laterality Date  . cataracts      Prior to Admission medications   Medication Sig Start Date End Date  Taking? Authorizing Provider  acetaminophen (TYLENOL) 325 MG tablet Take 325-650 mg by mouth every 6 (six) hours as needed for mild pain or headache.   Yes Historical Provider, MD  aspirin EC 81 MG tablet Take 81 mg by mouth daily.   Yes Historical Provider, MD  cholecalciferol (VITAMIN D) 1000 units tablet Take 1,000 Units by mouth daily.   Yes Historical Provider, MD  vitamin B-12 (CYANOCOBALAMIN) 1000 MCG tablet Take 1,000 mcg by mouth daily.   Yes Historical Provider, MD    Allergies Patient has no known allergies.  Family History  Problem Relation Age of Onset  . Diabetes Mother   . Cancer Daughter   . Diabetes Daughter   . Diabetes Son   . Diabetes Son   . Diabetes Son   . Diabetes Son     Social History Social History  Substance Use Topics  . Smoking status: Current Every Day Smoker    Packs/day: 1.00    Types: Cigarettes  . Smokeless tobacco: Never Used  . Alcohol use Yes     Comment: Occasionally once a month     Review of Systems Constitutional: No recent illness. Cardiovascular: Denies chest pain or palpitations. Respiratory: Denies shortness of breath. Musculoskeletal: Pain in Right lower back Skin: Negative for rash, wound, lesion. Neurological: Negative for focal weakness or numbness.Negative for loss of bowel bladder control.  ____________________________________________   PHYSICAL EXAM:  VITAL SIGNS: ED Triage Vitals  Enc Vitals Group     BP 05/04/16 1612 (!) 141/82     Pulse Rate 05/04/16 1612 77     Resp 05/04/16 1612 16     Temp 05/04/16 1612 97 F (36.1 C)     Temp Source 05/04/16 1612 Oral     SpO2 05/04/16 1612 96 %     Weight 05/04/16 1613 143 lb (64.9 kg)     Height 05/04/16 1613 5\' 6"  (1.676 m)     Head Circumference --      Peak Flow --      Pain Score 05/04/16 1613 10     Pain Loc --      Pain Edu? --      Excl. in GC? --     Constitutional: Alert and oriented. Well appearing and in no acute distress. Eyes: Conjunctivae are  normal. EOMI. Head: Atraumatic. Neck: No stridor.  Respiratory: Normal respiratory effort.   Musculoskeletal: Diffuse tenderness over right lumbar area. Decreased ROM of the right hip--patient reports as chronic. Neurologic:  Normal speech and language. No gross focal neurologic deficits are appreciated. Speech is normal. No gait instability. Skin:  Skin is warm, dry and intact. Atraumatic. Psychiatric: Mood and affect are normal. Speech and behavior are normal.  ____________________________________________   LABS (all labs ordered are listed, but only abnormal results are displayed)  Labs Reviewed - No data to display ____________________________________________  RADIOLOGY _IMPRESSION: 1. Mild compression fracture involving the upper endplate of L5 on the order of 15-20% or so, new since 2015 but age indeterminate. Please correlate with point tenderness. 2. Compression fracture involving the T12 vertebral body on the order of 70-80% or so, unchanged since the CT chest 2 weeks ago and therefore not acute. 3. Osseous demineralization. 4. Aortoiliac atherosclerosis without aneurysm .___________________________________________   PROCEDURES  Procedure(s) performed: None.   ____________________________________________   INITIAL IMPRESSION / ASSESSMENT AND PLAN / ED COURSE  83 year old male presenting to the emergency department for evaluation of lower back pain after a mechanical, non-syncopal fall while attempting to stand up out of his chair earlier today. Per RN that spoke with his [facility] provider, event was witnessed. Per visit notes from 04/28/2015, he has a compression fracture of the L2 vertebrae. Due to fall today and patient report of increasing pain, lumbar x-ray ordered. He will be given 1 tablet of tramadol here in the emergency department.  Point of tenderness not consistent with newly identified compression fracture at L5 and the patient denies radicular pain. He was  observed frequently ambulating through the department and requesting to be discharged. He will be sent home to follow up with orthopedics and PT/OT. He was instructed to return to the emergency department for symptoms that change or worsen if he is unable to see his primary care provider or the orthopedic specialist.  Pertinent labs & imaging results that were available during my care of the patient were reviewed by me and considered in my medical decision making (see chart for details).   ____________________________________________   FINAL CLINICAL IMPRESSION(S) / ED DIAGNOSES  Final diagnoses:  Acute lumbar back pain  Closed compression fracture of fifth lumbar vertebra, initial encounter (HCC)  Closed compression fracture of L2 lumbar vertebra, sequela       Chinita Pester, FNP 05/04/16 2344    Phineas Semen, MD 05/05/16 [phone]

## 2016-05-04 NOTE — ED Notes (Signed)
Patient is complaining of lower back pain x 1 week.  Patient denies known injury.  Patient reports ability to walk with walker but reports some difficulty.  Patient is alert and oriented x 4 at this time.  Patient lives at home alone.

## 2016-05-10 ENCOUNTER — Emergency Department: Payer: Medicare (Managed Care)

## 2016-05-10 ENCOUNTER — Emergency Department
Admission: EM | Admit: 2016-05-10 | Discharge: 2016-05-10 | Disposition: A | Discharge status: Home / Self Care | Payer: Medicare (Managed Care) | Attending: Emergency Medicine | Admitting: Emergency Medicine

## 2016-05-10 DIAGNOSIS — S32050A Wedge compression fracture of fifth lumbar vertebra, initial encounter for closed fracture: Secondary | ICD-10-CM | POA: Diagnosis not present

## 2016-05-10 DIAGNOSIS — S22080A Wedge compression fracture of T11-T12 vertebra, initial encounter for closed fracture: Secondary | ICD-10-CM | POA: Diagnosis not present

## 2016-05-10 DIAGNOSIS — Z7982 Long term (current) use of aspirin: Secondary | ICD-10-CM | POA: Insufficient documentation

## 2016-05-10 DIAGNOSIS — G9389 Other specified disorders of brain: Secondary | ICD-10-CM | POA: Diagnosis not present

## 2016-05-10 DIAGNOSIS — W19XXXA Unspecified fall, initial encounter: Secondary | ICD-10-CM

## 2016-05-10 DIAGNOSIS — S32000A Wedge compression fracture of unspecified lumbar vertebra, initial encounter for closed fracture: Secondary | ICD-10-CM

## 2016-05-10 DIAGNOSIS — Y999 Unspecified external cause status: Secondary | ICD-10-CM | POA: Diagnosis not present

## 2016-05-10 DIAGNOSIS — W06XXXA Fall from bed, initial encounter: Secondary | ICD-10-CM | POA: Insufficient documentation

## 2016-05-10 DIAGNOSIS — G8929 Other chronic pain: Secondary | ICD-10-CM

## 2016-05-10 DIAGNOSIS — Y929 Unspecified place or not applicable: Secondary | ICD-10-CM | POA: Insufficient documentation

## 2016-05-10 DIAGNOSIS — S22000A Wedge compression fracture of unspecified thoracic vertebra, initial encounter for closed fracture: Secondary | ICD-10-CM

## 2016-05-10 DIAGNOSIS — N183 Chronic kidney disease, stage 3 (moderate): Secondary | ICD-10-CM | POA: Insufficient documentation

## 2016-05-10 DIAGNOSIS — S3992XA Unspecified injury of lower back, initial encounter: Secondary | ICD-10-CM | POA: Diagnosis present

## 2016-05-10 DIAGNOSIS — Y939 Activity, unspecified: Secondary | ICD-10-CM | POA: Insufficient documentation

## 2016-05-10 DIAGNOSIS — M545 Low back pain: Secondary | ICD-10-CM

## 2016-05-10 LAB — URINALYSIS, COMPLETE (UACMP) WITH MICROSCOPIC
BACTERIA UA: NONE SEEN
BILIRUBIN URINE: NEGATIVE
Glucose, UA: NEGATIVE mg/dL
Ketones, ur: 20 mg/dL — AB
LEUKOCYTES UA: NEGATIVE
NITRITE: NEGATIVE
PH: 5 (ref 5.0–8.0)
Protein, ur: 30 mg/dL — AB
SPECIFIC GRAVITY, URINE: 1.025 (ref 1.005–1.030)

## 2016-05-10 LAB — COMPREHENSIVE METABOLIC PANEL
ALK PHOS: 71 U/L (ref 38–126)
ALT: 18 U/L (ref 17–63)
ANION GAP: 13 (ref 5–15)
AST: 49 U/L — ABNORMAL HIGH (ref 15–41)
Albumin: 3.7 g/dL (ref 3.5–5.0)
BUN: 28 mg/dL — ABNORMAL HIGH (ref 6–20)
CALCIUM: 9.6 mg/dL (ref 8.9–10.3)
CO2: 25 mmol/L (ref 22–32)
CREATININE: 1.72 mg/dL — AB (ref 0.61–1.24)
Chloride: 100 mmol/L — ABNORMAL LOW (ref 101–111)
GFR, EST AFRICAN AMERICAN: 41 mL/min — AB (ref >60)
GFR, EST NON AFRICAN AMERICAN: 36 mL/min — AB (ref >60)
Glucose, Bld: 97 mg/dL (ref 65–99)
Potassium: 4 mmol/L (ref 3.5–5.1)
Sodium: 138 mmol/L (ref 135–145)
TOTAL PROTEIN: 7.1 g/dL (ref 6.5–8.1)
Total Bilirubin: 2.7 mg/dL — ABNORMAL HIGH (ref 0.3–1.2)

## 2016-05-10 LAB — CBC WITH DIFFERENTIAL/PLATELET
Basophils Absolute: 0.1 10*3/uL (ref 0–0.1)
Basophils Relative: 1 %
EOS ABS: 0.1 10*3/uL (ref 0–0.7)
EOS PCT: 1 %
HCT: 44.4 % (ref 40.0–52.0)
HEMOGLOBIN: 15.2 g/dL (ref 13.0–18.0)
LYMPHS ABS: 0.8 10*3/uL — AB (ref 1.0–3.6)
LYMPHS PCT: 8 %
MCH: 33 pg (ref 26.0–34.0)
MCHC: 34.3 g/dL (ref 32.0–36.0)
MCV: 96.3 fL (ref 80.0–100.0)
MONOS PCT: 7 %
Monocytes Absolute: 0.8 10*3/uL (ref 0.2–1.0)
NEUTROS PCT: 83 %
Neutro Abs: 9.2 10*3/uL — ABNORMAL HIGH (ref 1.4–6.5)
Platelets: 93 10*3/uL — ABNORMAL LOW (ref 150–440)
RBC: 4.61 MIL/uL (ref 4.40–5.90)
RDW: 14.4 % (ref 11.5–14.5)
WBC: 10.9 10*3/uL — AB (ref 3.8–10.6)

## 2016-05-10 MED ORDER — IBUPROFEN 600 MG PO TABS
600.0000 mg | ORAL_TABLET | Freq: Once | ORAL | Status: AC
  Administered 2016-05-10: 600 mg via ORAL
  Filled 2016-05-10: qty 1

## 2016-05-10 MED ORDER — LIDOCAINE 5 % EX PTCH
1.0000 | MEDICATED_PATCH | CUTANEOUS | Status: DC
  Administered 2016-05-10: 1 via TRANSDERMAL
  Filled 2016-05-10: qty 1

## 2016-05-10 NOTE — ED Provider Notes (Signed)
[facility] Emergency Department Provider Note   ____________________________________________   First MD Initiated Contact with Patient 05/10/16 [phone]     (approximate)  I have reviewed the triage vital signs and the nursing notes.   HISTORY  Chief Complaint Fall    HPI Raymond MichaelsMaurice Stech is a 83 y.o. male who comes into the hospital today with some pain in his back. He reports that he couldn't take it anymore. He reports that this pain in his right back. It started quite a while ago. The patient states he fell off the couch some time ago and he's been having pain since then. He states that someone tried to pull him up and he felt like they were tearing him in half. The patient denies taken anything for pain. According to EMS the patient had an unwitnessed fall tonight. He states he fell off the bed and crawled to the bathroom but they're unsure. No one knows if he hit his head. The patient states his pain is a 9 a half out of 10 in intensity. The patient denies any chest pain, shortness of breath, abdominal pain. He did have some low oxygen saturation per EMS. He states that he does not take anything for pain. The patient is here today for evaluation.   Past Medical History:  Diagnosis Date  . Atrial fibrillation (HCC)   . Benign prostatic hypertrophy   . Chronic kidney disease, stage III (moderate)   . Complete edentulism   . Dementia   . Edema   . Hallucinations   . Hemiplegia affecting non-dominant side, post-stroke (HCC)   . Late effect of stroke   . Osteoporosis   . Renal insufficiency   . Special screening for malignant neoplasm of prostate   . Stroke (HCC)   . Unsteady gait   . Vitamin B12 deficiency   . Vitamin D deficiency     Patient Active Problem List   Diagnosis Date Noted  . Syncope 04/26/2016  . CKD (chronic kidney disease), stage III 04/26/2016  . Dementia with behavioral disturbance 12/11/2015  . Atrial fibrillation (HCC)  12/11/2015  . Circulation problem 12/11/2015  . Hyperlipidemia 07/31/2015  . Cigarette smoker 07/31/2015  . DVT (deep venous thrombosis) (HCC) 07/31/2015  . Positive D dimer 07/30/2015  . Elevated bilirubin 07/30/2015  . Macrocytosis without anemia 07/30/2015  . Thrombocytopenia (HCC) 07/30/2015    Past Surgical History:  Procedure Laterality Date  . cataracts      Prior to Admission medications   Medication Sig Start Date End Date Taking? Authorizing Provider  acetaminophen (TYLENOL) 325 MG tablet Take 325-650 mg by mouth every 6 (six) hours as needed for mild pain or headache.    Historical Provider, MD  aspirin EC 81 MG tablet Take 81 mg by mouth daily.    Historical Provider, MD  cholecalciferol (VITAMIN D) 1000 units tablet Take 1,000 Units by mouth daily.    Historical Provider, MD  vitamin B-12 (CYANOCOBALAMIN) 1000 MCG tablet Take 1,000 mcg by mouth daily.    Historical Provider, MD    Allergies Patient has no known allergies.  Family History  Problem Relation Age of Onset  . Diabetes Mother   . Cancer Daughter   . Diabetes Daughter   . Diabetes Son   . Diabetes Son   . Diabetes Son   . Diabetes Son     Social History Social History  Substance Use Topics  . Smoking status: Current Every Day Smoker    Packs/day:  1.00    Types: Cigarettes  . Smokeless tobacco: Never Used  . Alcohol use Yes     Comment: Occasionally once a month     Review of Systems Constitutional: No fever/chills Eyes: No visual changes. ENT: No sore throat. Neck: no cervical spine tenderness to palpation  Cardiovascular: Denies chest pain. Respiratory: Denies shortness of breath. Gastrointestinal: No abdominal pain.  No nausea, no vomiting.  No diarrhea.  No constipation. Genitourinary: Negative for dysuria. Musculoskeletal:  back pain. Skin: Negative for rash. Neurological: Negative for headaches, focal weakness or numbness.  10-point ROS otherwise  negative.  ____________________________________________   PHYSICAL EXAM:  VITAL SIGNS: ED Triage Vitals  Enc Vitals Group     BP 05/10/16 0530 115/82     Pulse Rate 05/10/16 0530 80     Resp 05/10/16 0532 20     Temp 05/10/16 0532 97.4 F (36.3 C)     Temp Source 05/10/16 0532 Oral     SpO2 05/10/16 0530 93 %     Weight 05/10/16 0533 140 lb (63.5 kg)     Height 05/10/16 0533 5\' 8"  (1.727 m)     Head Circumference --      Peak Flow --      Pain Score --      Pain Loc --      Pain Edu? --      Excl. in GC? --     Constitutional: Alert and oriented. Well appearing and in Mild distress. Eyes: Conjunctivae are normal. PERRL. EOMI. Head: Atraumatic. Nose: No congestion/rhinnorhea. Mouth/Throat: Mucous membranes are moist.  Oropharynx non-erythematous. Cardiovascular: Normal rate, regular rhythm. Grossly normal heart sounds.  Good peripheral circulation. Respiratory: Normal respiratory effort.  No retractions. Lungs CTAB. Gastrointestinal: Soft and nontender. No distention. Positive bowel sounds Musculoskeletal: No lower extremity tenderness nor edema. Tenderness to palpation of right sided back.  Neurologic:  Normal speech and language.  Skin:  Skin is warm, dry and intact.  Psychiatric: Mood and affect are normal.   ____________________________________________   LABS (all labs ordered are listed, but only abnormal results are displayed)  Labs Reviewed  CBC WITH DIFFERENTIAL/PLATELET - Abnormal; Notable for the following:       Result Value   WBC 10.9 (*)    Platelets 93 (*)    Neutro Abs 9.2 (*)    Lymphs Abs 0.8 (*)    All other components within normal limits  COMPREHENSIVE METABOLIC PANEL - Abnormal; Notable for the following:    Chloride 100 (*)    BUN 28 (*)    Creatinine, Ser 1.72 (*)    AST 49 (*)    Total Bilirubin 2.7 (*)    GFR calc non Af Amer 36 (*)    GFR calc Af Amer 41 (*)    All other components within normal limits  CBC WITH  DIFFERENTIAL/PLATELET  URINALYSIS, COMPLETE (UACMP) WITH MICROSCOPIC   ____________________________________________  EKG  ED ECG REPORT I, Raymond Hughes, the attending physician, personally viewed and interpreted this ECG.   Date: 05/10/2016  EKG Time: 536  Rate: 81  Rhythm: atrial fibrillation, rate 81  Axis: normal  Intervals:none  ST&T Change: flipped t wave in lead V5  ____________________________________________  RADIOLOGY  CT head and cervical spine Lumbar spine xray CXR ____________________________________________   PROCEDURES  Procedure(s) performed: None  Procedures  Critical Care performed: No  ____________________________________________   INITIAL IMPRESSION / ASSESSMENT AND PLAN / ED COURSE  Pertinent labs & imaging results that were  available during my care of the patient were reviewed by me and considered in my medical decision making (see chart for details).  This is an 83 year old male who comes into the hospital today with back pain. The patient has had multiple episodes of back pain in the last few weeks. He does have a T12 compression fracture as well as L5 compression fracture. I will place a Lidoderm patch on the patient's back and I will give him a dose of tramadol. The patient did receive an x-ray of his back and his chest which showed the compression fractures that were unchanged. I will also do a CT scan of the patient's head.  Clinical Course as of May 10 844  Wynelle Link May 10, 2016  1610 Emphysematous changes and fibrosis in the lungs. Cardiac enlargement. No evidence of active pulmonary disease.   DG Chest 2 View [AW]  0735 Compression fractures of T12 and L5 are unchanged since prior study. No acute compression deformities are demonstrated.   DG Lumbar Spine 2-3 Views [AW]    Clinical Course User Index [AW] Raymond Apley, MD    The patient's care will be signed out to Dr. Shaune Pollack will follow-up the results of the CT scan. I  will give the patient some ibuprofen as well for his back pain and Dr. Shaune Pollack will reassess the patient. ____________________________________________   FINAL CLINICAL IMPRESSION(S) / ED DIAGNOSES  Final diagnoses:  Fall  Chronic right-sided low back pain, with sciatica presence unspecified  Compression fx, lumbar spine, closed, initial encounter (HCC)  Compression fracture of body of thoracic vertebra (HCC)      NEW MEDICATIONS STARTED DURING THIS VISIT:  New Prescriptions   No medications on file     Note:  This document was prepared using Dragon voice recognition software and may include unintentional dictation errors.    Raymond Apley, MD 05/10/16 [id number]

## 2016-05-10 NOTE — ED Triage Notes (Signed)
Pt with unwitnessed fall from [address]. Pt complains of low back pain, left kne pain. Ems states pt with pox on ra in low 90s. Pt currently on 2lpm via [place] oxygen with pox of98%.

## 2016-05-10 NOTE — ED Notes (Signed)
Pt able to ambulate with walker well.

## 2016-05-10 NOTE — ED Notes (Signed)
PT given coffee and snack.

## 2016-05-10 NOTE — ED Notes (Signed)
Called pts daughter, will pick up pt

## 2016-05-10 NOTE — ED Notes (Signed)
Report to felicia, rn.  

## 2016-05-10 NOTE — ED Provider Notes (Signed)
[facility]  I accepted care from Dr. Zenda Alpers ____________________________________________    LABS (pertinent positives/negatives)  Labs Reviewed  URINALYSIS, COMPLETE (UACMP) WITH MICROSCOPIC - Abnormal; Notable for the following:       Result Value   Color, Urine AMBER (*)    APPearance CLEAR (*)    Hgb urine dipstick SMALL (*)    Ketones, ur 20 (*)    Protein, ur 30 (*)    Squamous Epithelial / LPF 0-5 (*)    All other components within normal limits  CBC WITH DIFFERENTIAL/PLATELET - Abnormal; Notable for the following:    WBC 10.9 (*)    Platelets 93 (*)    Neutro Abs 9.2 (*)    Lymphs Abs 0.8 (*)    All other components within normal limits  COMPREHENSIVE METABOLIC PANEL - Abnormal; Notable for the following:    Chloride 100 (*)    BUN 28 (*)    Creatinine, Ser 1.72 (*)    AST 49 (*)    Total Bilirubin 2.7 (*)    GFR calc non Af Amer 36 (*)    GFR calc Af Amer 41 (*)    All other components within normal limits  CBC WITH DIFFERENTIAL/PLATELET     ____________________________________________    RADIOLOGY All xrays were viewed by me. Imaging interpreted by radiologist.  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 Chronic microvascular disease. No acute intracranial abnormality.  No acute findings in the cervical spine.  Chest x-ray two-view:  IMPRESSION: Emphysematous changes and fibrosis in the lungs. Cardiac enlargement. No evidence of active pulmonary disease.  Lumbar spine 2 views:  IMPRESSION: Compression fractures of T12 and L5 are unchanged since prior study. No acute compression deformities are demonstrated.      ____________________________________________   PROCEDURES  Procedure(s) performed: None  Critical Care performed: None  ____________________________________________   INITIAL IMPRESSION / ASSESSMENT AND PLAN / ED COURSE   Pertinent labs & imaging results  that were available during my care of the patient were reviewed by me and considered in my medical decision making (see chart for details).  I accepted care for this patient who was awaiting imaging studies as well as urinalysis. Patient states that his back pain is a little bit better now than it was. His laboratory studies are reassuring. No elevated white blood cell count although he does have increased from his previous baseline as well as a left shift, uncertain etiology of this.  At this point without neurologic deficit and pain is relieved, I don't have a high suspicion for spinal abscess and I'm not recommending additional MRI imaging of the spine at this point in time.  He is not febrile. Stable vital signs. Pain is improved. Head CT and cervical spine are reassuring for no acute findings. Lumbar spine shows known compression fractures which I suspect are the source of his pain.  Any case, he was walked up to his feet with a walker which is his baseline, and I think he is okay for outpatient management and follow-up. I will encourage him to take small doses of ibuprofen to help with the pain, and have close follow-up this week.  We discussed return precautions specifically with respect to any neurologic symptoms, worsening pain, or fever.  CONSULTATIONS: None    Patient / Family / Caregiver informed of clinical course, medical decision-making process, and agree with plan.   I discussed return precautions, follow-up instructions, and discharged instructions  with patient and/or family.     ____________________________________________   FINAL CLINICAL IMPRESSION(S) / ED DIAGNOSES  Final diagnoses:  Fall  Chronic right-sided low back pain, with sciatica presence unspecified  Compression fx, lumbar spine, closed, initial encounter (HCC)  Compression fracture of body of thoracic vertebra (HCC)        [name], MD 05/12/16 2251

## 2016-05-10 NOTE — Discharge Instructions (Signed)
You were evaluated for back pain, and need to have compression fractures there and I suspect this is the source of the pain. Your exam and evaluation in the emergency department are reassuring today. Return to the emergency department immediately for any worsening pain, weakness, numbness, or peeing or pooping on yourself, fever, abdominal pain, urinary symptoms, or any other symptoms concerning to you.  You may try over-the-counter ibuprofen 400 mg every 8 hours as needed for pain, please been sure you drink plenty of fluids and do not use over 1 week unless you've been instructed further by your primary care doctor.

## 2016-05-10 NOTE — ED Notes (Signed)
Pt given food

## 2016-07-07 ENCOUNTER — Ambulatory Visit (INDEPENDENT_AMBULATORY_CARE_PROVIDER_SITE_OTHER): Payer: Medicare (Managed Care) | Admitting: Vascular Surgery

## 2017-01-28 DEATH — deceased

## 2018-03-11 IMAGING — CT CT ANGIO CHEST
2 of 6 series · 19 of 46 positions shown · IV contrast (APPLIED)
Comparison: Chest radiograph earlier same date.

CLINICAL DATA: Patient status post fall.

EXAM:
CT ANGIOGRAPHY CHEST WITH CONTRAST
TECHNIQUE: Multidetector CT imaging of the chest was performed using the
standard protocol during bolus administration of intravenous
contrast. Multiplanar CT image reconstructions and MIPs were
obtained to evaluate the vascular anatomy.
CONTRAST:  75 cc Isovue 370

[Series 5: thins · axial · 0.66mm/px · z∈[-1072,-820]mm · 17 of 277 slices shown]
[im 13/277  lung]
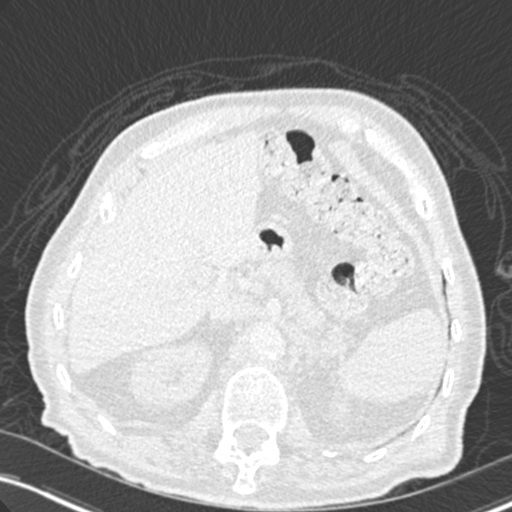
[im 25/277  soft-tissue]
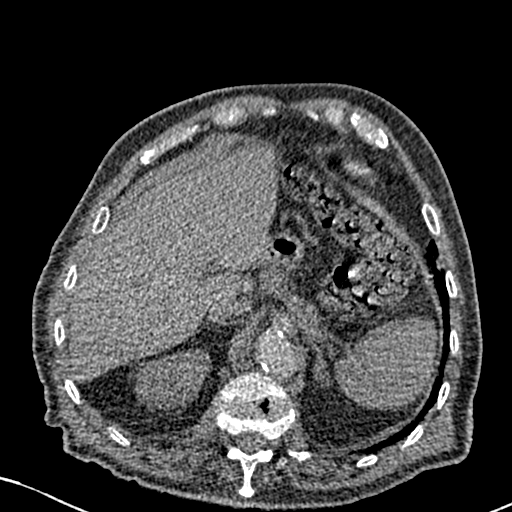
[im 49/277  lung]
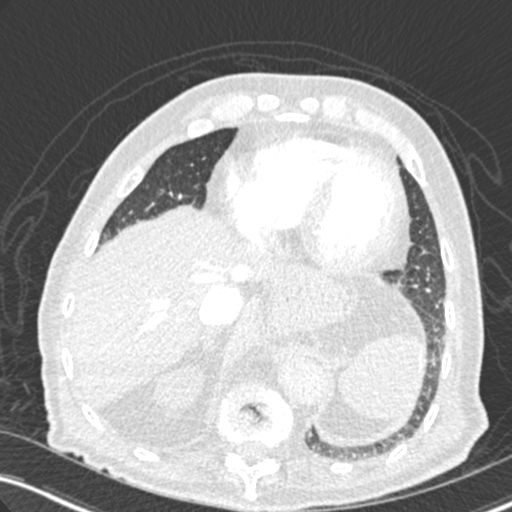
[im 61/277  soft-tissue]
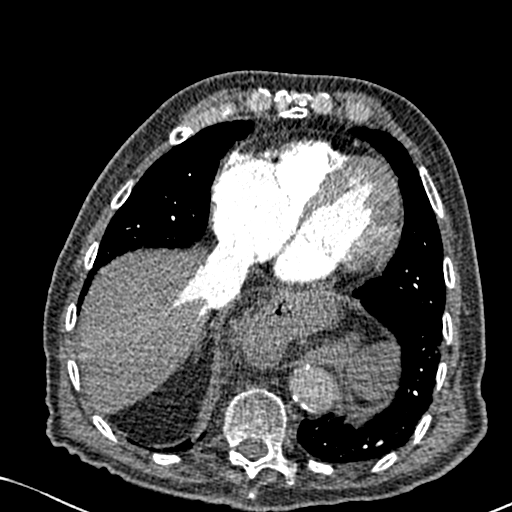
[im 73/277  lung]
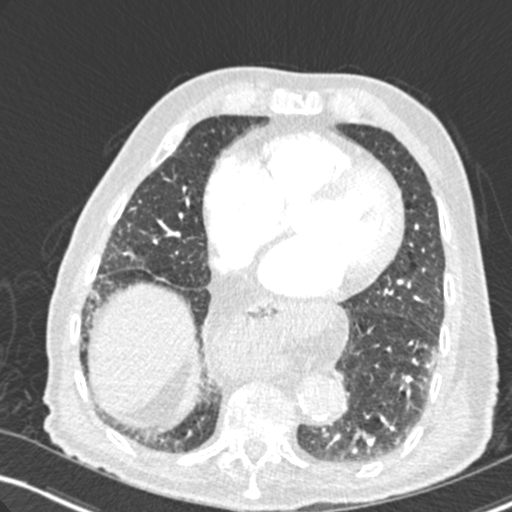
[im 97/277  soft-tissue]
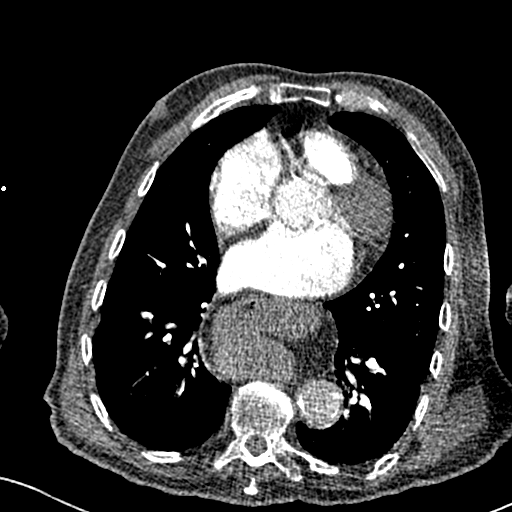
[im 109/277  lung]
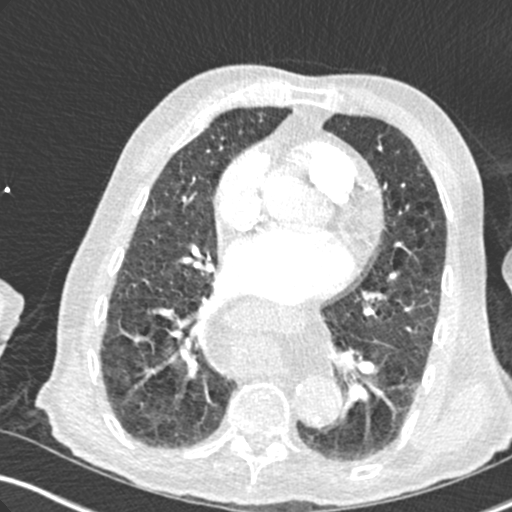
[im 121/277  soft-tissue]
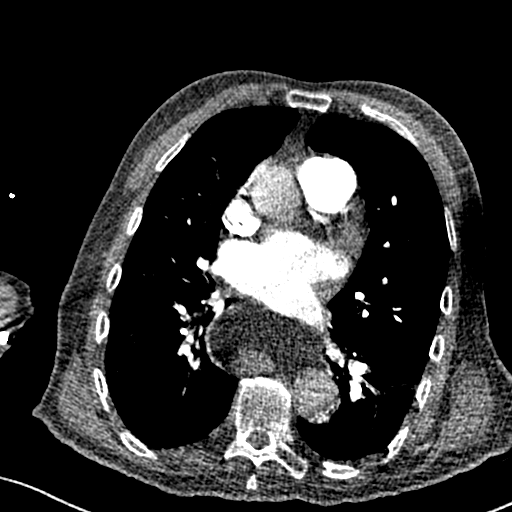
[im 145/277  lung]
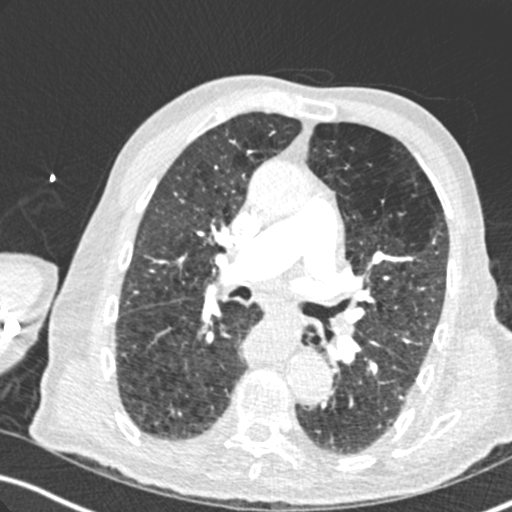
[im 157/277  soft-tissue]
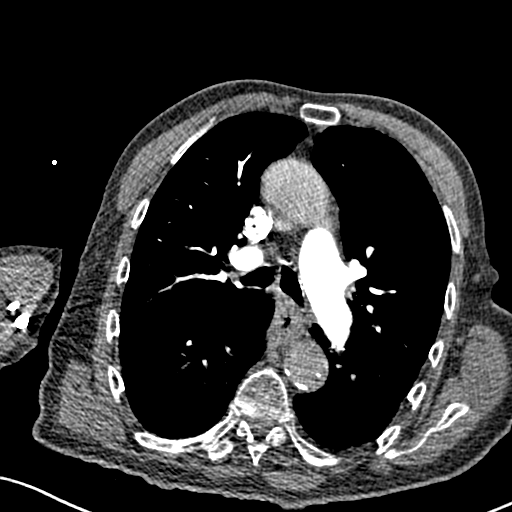
[im 169/277  lung]
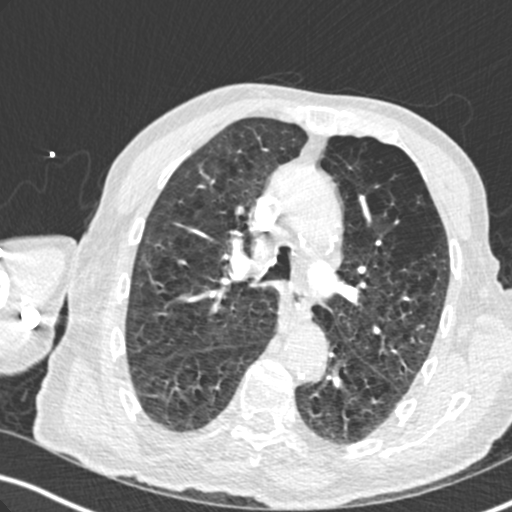
[im 181/277  soft-tissue]
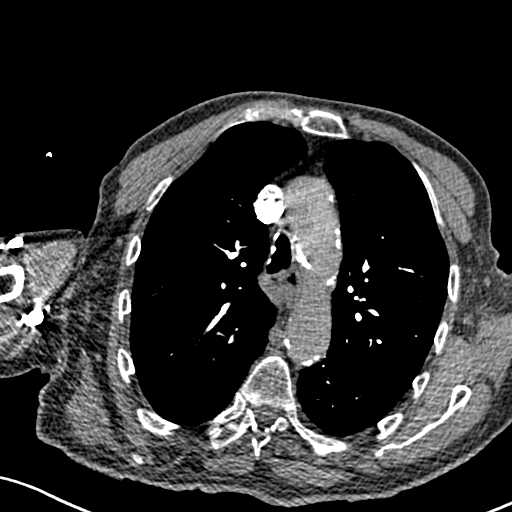
[im 205/277  lung]
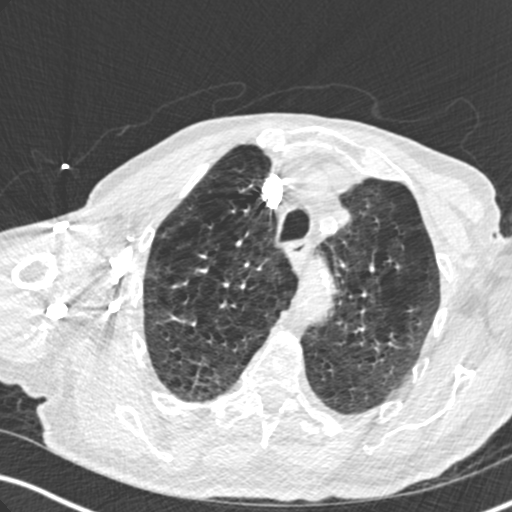
[im 217/277  soft-tissue]
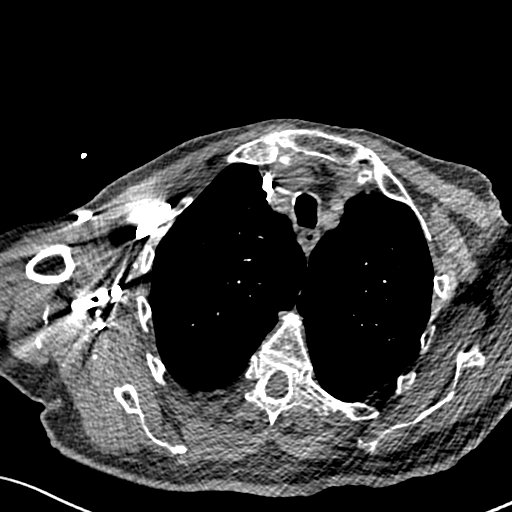
[im 229/277  lung]
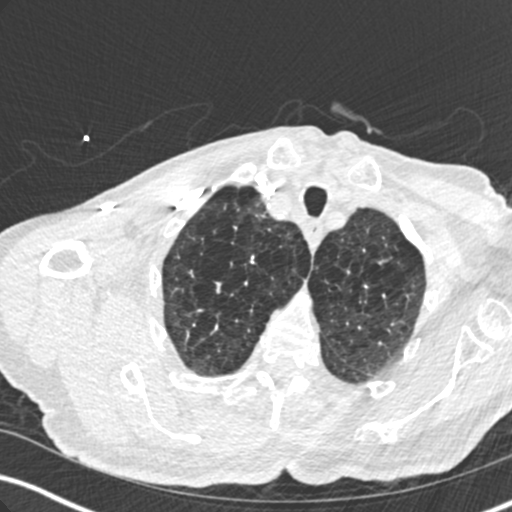
[im 253/277  soft-tissue]
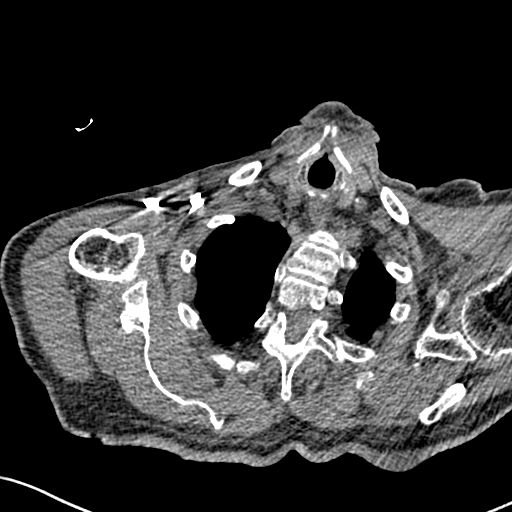
[im 265/277  lung]
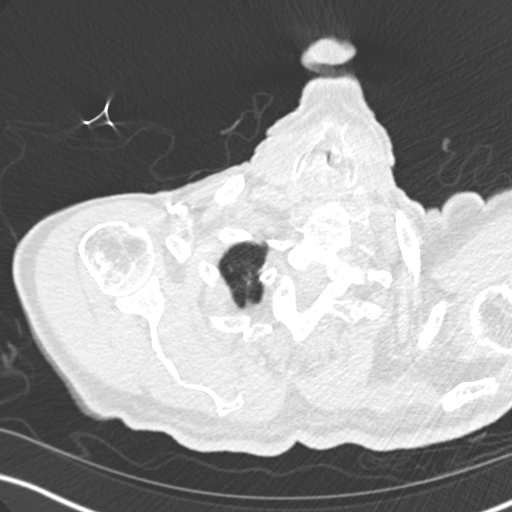

[Series 7: coronal mpr · coronal · 0.58mm/px · 2 of 95 slices shown]
[im 32/95  soft-tissue]
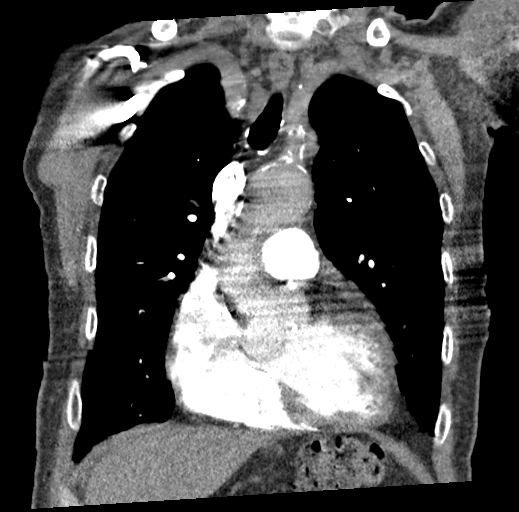
[im 63/95  soft-tissue]
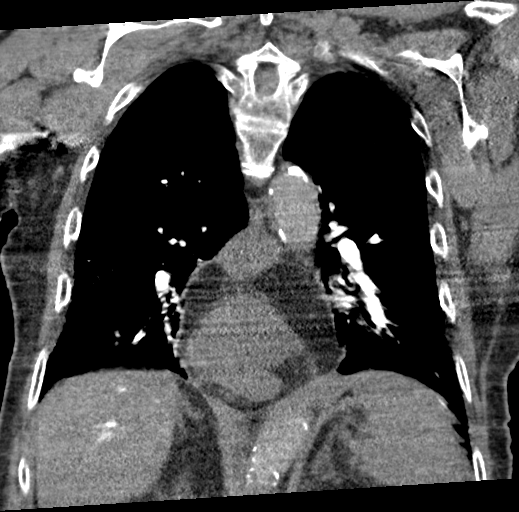

[19 of 46 positions shown; findings below may reference images not displayed]

FINDINGS: Cardiovascular: Normal heart size. No pericardial effusion. Aorta
and main pulmonary artery are normal in caliber. Coronary arterial
vascular calcifications. Vascular calcifications involving the
thoracic aorta. Adequate opacification of the pulmonary arterial
system. Motion artifact limits evaluation. No evidence for central,
main or lobar pulmonary emboli.

Mediastinum/Nodes: Moderate size hiatal hernia. No enlarged
axillary, mediastinal or hilar lymphadenopathy.

Lungs/Pleura: Probable dependent mucus within the trachea.
Centrilobular and paraseptal emphysematous change. Dependent
atelectasis within the lower lobes bilaterally. No pleural effusion
or pneumothorax.

Upper Abdomen: Unremarkable.

Musculoskeletal: Thoracic spine degenerative changes. Lower thoracic
spine compression deformity, likely chronic as this appears stable
when compared to prior chest radiographs. No aggressive or acute
appearing osseous lesions.

Review of the MIP images confirms the above findings.
IMPRESSION: Evaluation limited due to motion artifact. Within the above
limitation no evidence for central, main, lobar or segmental
pulmonary embolus.

No acute process within the chest.

Likely chronic compression deformity involving the lower thoracic
spine vertebral body. Recommend correlation with point tenderness.

Aortic atherosclerosis.

## 2018-03-11 IMAGING — CR DG CHEST 2V
1 series · 2 of 2 positions shown · non-contrast
Comparison: Prior radiograph from/ [DATE].

CLINICAL DATA: Initial evaluation for acute trauma, fall
right-sided pain.

EXAM:
CHEST  2 VIEW

[Series 1: dg chest 2 view · 0.14mm/px · 2 of 2 slices shown]
[im 1/2]
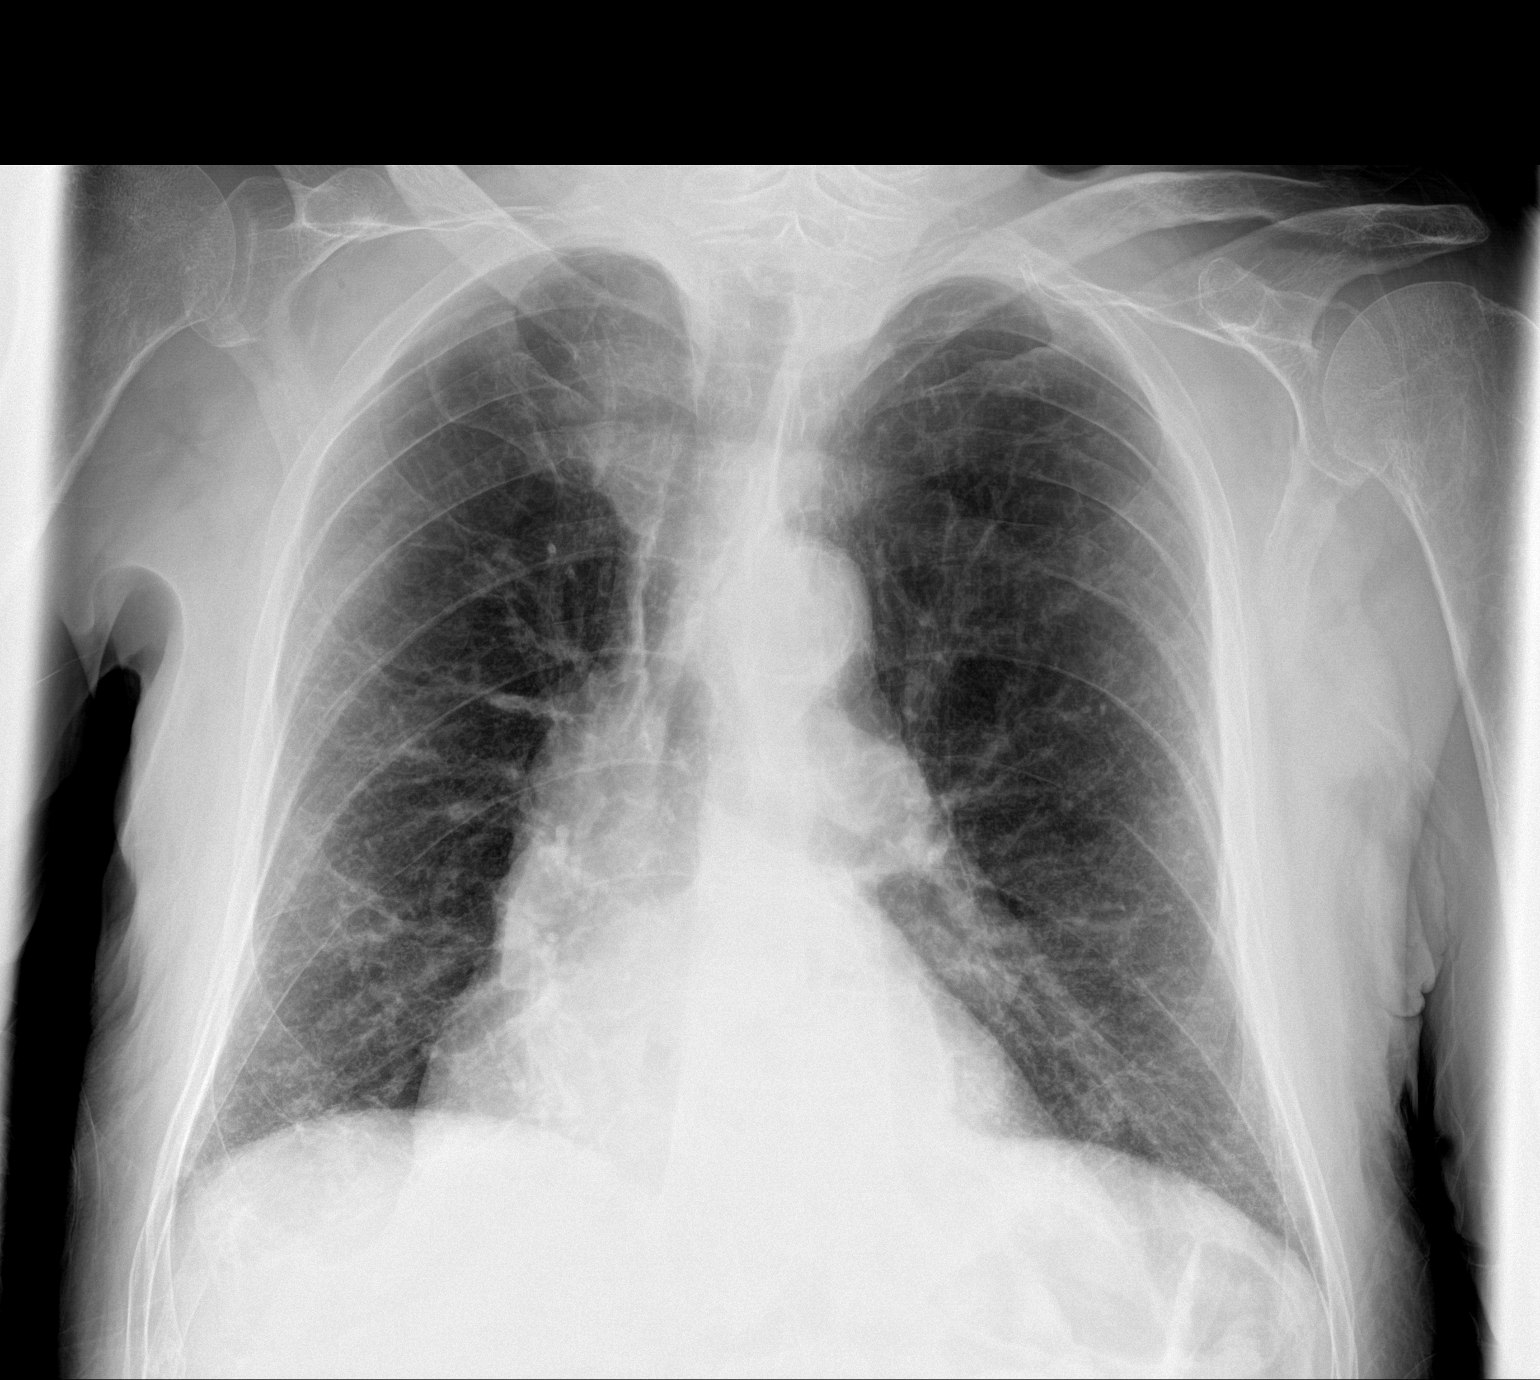
[im 2/2]
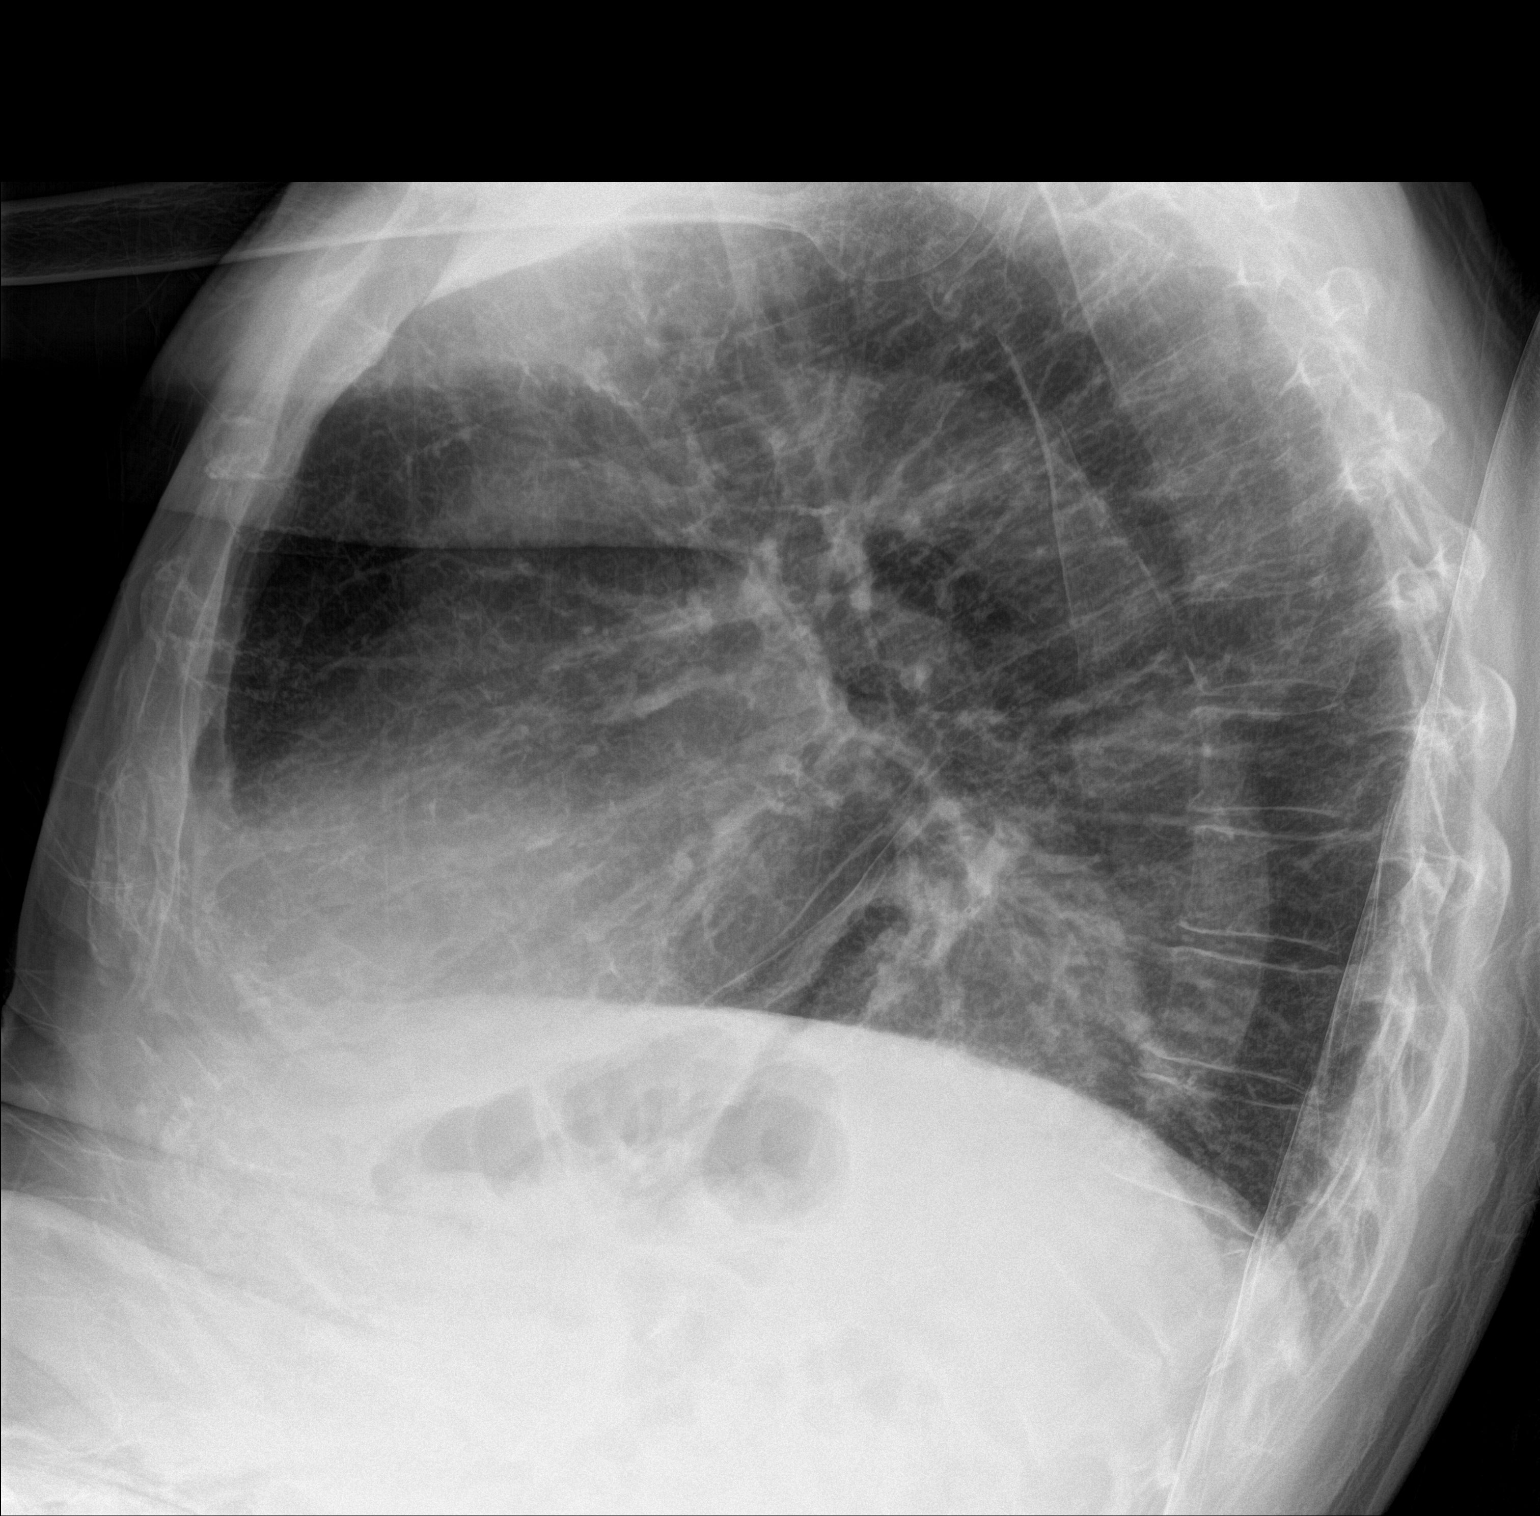

[2 of 2 positions shown; findings below may reference images not displayed]

FINDINGS: Mild cardiomegaly, stable. Mediastinal silhouette within normal
limits. Aortic atherosclerosis noted. Moderate size hiatal hernia
noted as well.

Lungs mildly hypoinflated. Chronic coarsening of the interstitial
markings present. No focal infiltrates. No pulmonary edema or
pleural effusion. No pneumothorax.

No acute osseous abnormality. Chronic compression deformity within
the lower thoracic spine is similar to previous. Osteopenia noted.
IMPRESSION: 1. Chronic coarsening of the interstitial markings with no active
cardiopulmonary disease identified.
2. Hiatal hernia.
3. Chronic compression deformity within the lower thoracic spine,
similar from previous.
4. Aortic atherosclerosis.
